# Patient Record
Sex: Female | Born: 1945 | Race: White | Hispanic: No | Marital: Married | Smoking: Never smoker
Health system: Southern US, Community
[De-identification: ages and names within clinical notes are randomized; demographics above are authoritative.]

## PROBLEM LIST (undated history)

## (undated) DIAGNOSIS — F329 Major depressive disorder, single episode, unspecified: Secondary | ICD-10-CM

## (undated) DIAGNOSIS — E785 Hyperlipidemia, unspecified: Secondary | ICD-10-CM

## (undated) DIAGNOSIS — I2699 Other pulmonary embolism without acute cor pulmonale: Secondary | ICD-10-CM

## (undated) DIAGNOSIS — I10 Essential (primary) hypertension: Secondary | ICD-10-CM

## (undated) DIAGNOSIS — R55 Syncope and collapse: Secondary | ICD-10-CM

## (undated) DIAGNOSIS — F32A Depression, unspecified: Secondary | ICD-10-CM

## (undated) DIAGNOSIS — E213 Hyperparathyroidism, unspecified: Secondary | ICD-10-CM

## (undated) HISTORY — DX: Syncope and collapse: R55

## (undated) HISTORY — PX: ORIF ANKLE FRACTURE: SHX5408

## (undated) HISTORY — PX: OTHER SURGICAL HISTORY: SHX169

## (undated) HISTORY — PX: PARATHYROIDECTOMY: SHX19

## (undated) HISTORY — PX: TONSILLECTOMY: SUR1361

---

## 2012-08-09 DIAGNOSIS — E785 Hyperlipidemia, unspecified: Secondary | ICD-10-CM | POA: Insufficient documentation

## 2013-05-15 DIAGNOSIS — M810 Age-related osteoporosis without current pathological fracture: Secondary | ICD-10-CM | POA: Insufficient documentation

## 2013-05-15 DIAGNOSIS — J309 Allergic rhinitis, unspecified: Secondary | ICD-10-CM | POA: Insufficient documentation

## 2013-05-15 DIAGNOSIS — M1990 Unspecified osteoarthritis, unspecified site: Secondary | ICD-10-CM | POA: Insufficient documentation

## 2013-05-15 DIAGNOSIS — K219 Gastro-esophageal reflux disease without esophagitis: Secondary | ICD-10-CM | POA: Insufficient documentation

## 2013-05-21 DIAGNOSIS — N183 Chronic kidney disease, stage 3 unspecified: Secondary | ICD-10-CM | POA: Insufficient documentation

## 2013-05-21 DIAGNOSIS — R7303 Prediabetes: Secondary | ICD-10-CM | POA: Insufficient documentation

## 2013-06-16 DIAGNOSIS — F339 Major depressive disorder, recurrent, unspecified: Secondary | ICD-10-CM | POA: Insufficient documentation

## 2013-10-03 DIAGNOSIS — I2699 Other pulmonary embolism without acute cor pulmonale: Secondary | ICD-10-CM

## 2013-10-03 HISTORY — DX: Other pulmonary embolism without acute cor pulmonale: I26.99

## 2013-10-23 ENCOUNTER — Encounter (HOSPITAL_COMMUNITY): Payer: Self-pay | Admitting: Internal Medicine

## 2013-10-23 ENCOUNTER — Inpatient Hospital Stay (HOSPITAL_COMMUNITY)
Admission: AD | Admit: 2013-10-23 | Discharge: 2013-11-02 | DRG: 175 | Disposition: A | Discharge status: Home Health | Payer: Medicare Other | Source: Other Acute Inpatient Hospital | Attending: Emergency Medicine | Admitting: Emergency Medicine

## 2013-10-23 ENCOUNTER — Inpatient Hospital Stay (HOSPITAL_COMMUNITY): Payer: Medicare Other

## 2013-10-23 DIAGNOSIS — J9620 Acute and chronic respiratory failure, unspecified whether with hypoxia or hypercapnia: Secondary | ICD-10-CM | POA: Diagnosis present

## 2013-10-23 DIAGNOSIS — R0682 Tachypnea, not elsewhere classified: Secondary | ICD-10-CM | POA: Diagnosis present

## 2013-10-23 DIAGNOSIS — E8779 Other fluid overload: Secondary | ICD-10-CM | POA: Diagnosis present

## 2013-10-23 DIAGNOSIS — I82409 Acute embolism and thrombosis of unspecified deep veins of unspecified lower extremity: Secondary | ICD-10-CM | POA: Diagnosis present

## 2013-10-23 DIAGNOSIS — J90 Pleural effusion, not elsewhere classified: Secondary | ICD-10-CM | POA: Diagnosis present

## 2013-10-23 DIAGNOSIS — I10 Essential (primary) hypertension: Secondary | ICD-10-CM | POA: Diagnosis present

## 2013-10-23 DIAGNOSIS — J189 Pneumonia, unspecified organism: Secondary | ICD-10-CM | POA: Diagnosis present

## 2013-10-23 DIAGNOSIS — S82899A Other fracture of unspecified lower leg, initial encounter for closed fracture: Secondary | ICD-10-CM | POA: Diagnosis present

## 2013-10-23 DIAGNOSIS — J9601 Acute respiratory failure with hypoxia: Secondary | ICD-10-CM

## 2013-10-23 DIAGNOSIS — F329 Major depressive disorder, single episode, unspecified: Secondary | ICD-10-CM | POA: Diagnosis present

## 2013-10-23 DIAGNOSIS — I2692 Saddle embolus of pulmonary artery without acute cor pulmonale: Principal | ICD-10-CM | POA: Diagnosis present

## 2013-10-23 DIAGNOSIS — E785 Hyperlipidemia, unspecified: Secondary | ICD-10-CM | POA: Diagnosis present

## 2013-10-23 DIAGNOSIS — F3289 Other specified depressive episodes: Secondary | ICD-10-CM | POA: Diagnosis present

## 2013-10-23 DIAGNOSIS — J9621 Acute and chronic respiratory failure with hypoxia: Secondary | ICD-10-CM | POA: Diagnosis present

## 2013-10-23 DIAGNOSIS — E871 Hypo-osmolality and hyponatremia: Secondary | ICD-10-CM | POA: Diagnosis present

## 2013-10-23 DIAGNOSIS — R918 Other nonspecific abnormal finding of lung field: Secondary | ICD-10-CM

## 2013-10-23 DIAGNOSIS — I2699 Other pulmonary embolism without acute cor pulmonale: Secondary | ICD-10-CM

## 2013-10-23 DIAGNOSIS — E876 Hypokalemia: Secondary | ICD-10-CM | POA: Diagnosis present

## 2013-10-23 DIAGNOSIS — D649 Anemia, unspecified: Secondary | ICD-10-CM | POA: Diagnosis present

## 2013-10-23 DIAGNOSIS — Z86711 Personal history of pulmonary embolism: Secondary | ICD-10-CM | POA: Diagnosis present

## 2013-10-23 DIAGNOSIS — R0602 Shortness of breath: Secondary | ICD-10-CM | POA: Diagnosis present

## 2013-10-23 DIAGNOSIS — E213 Hyperparathyroidism, unspecified: Secondary | ICD-10-CM | POA: Diagnosis present

## 2013-10-23 HISTORY — DX: Hyperparathyroidism, unspecified: E21.3

## 2013-10-23 HISTORY — DX: Major depressive disorder, single episode, unspecified: F32.9

## 2013-10-23 HISTORY — DX: Depression, unspecified: F32.A

## 2013-10-23 HISTORY — DX: Hyperlipidemia, unspecified: E78.5

## 2013-10-23 HISTORY — DX: Essential (primary) hypertension: I10

## 2013-10-23 HISTORY — DX: Other pulmonary embolism without acute cor pulmonale: I26.99

## 2013-10-23 LAB — CBC WITH DIFFERENTIAL/PLATELET
Basophils Absolute: 0 10*3/uL (ref 0.0–0.1)
Basophils Relative: 0 % (ref 0–1)
EOS ABS: 0 10*3/uL (ref 0.0–0.7)
Eosinophils Relative: 0 % (ref 0–5)
HCT: 27.4 % — ABNORMAL LOW (ref 36.0–46.0)
HEMOGLOBIN: 9.3 g/dL — AB (ref 12.0–15.0)
Lymphocytes Relative: 6 % — ABNORMAL LOW (ref 12–46)
Lymphs Abs: 0.8 10*3/uL (ref 0.7–4.0)
MCH: 29.2 pg (ref 26.0–34.0)
MCHC: 33.9 g/dL (ref 30.0–36.0)
MCV: 86.2 fL (ref 78.0–100.0)
MONOS PCT: 12 % (ref 3–12)
Monocytes Absolute: 1.7 10*3/uL — ABNORMAL HIGH (ref 0.1–1.0)
NEUTROS PCT: 82 % — AB (ref 43–77)
Neutro Abs: 12 10*3/uL — ABNORMAL HIGH (ref 1.7–7.7)
Platelets: 285 10*3/uL (ref 150–400)
RBC: 3.18 MIL/uL — ABNORMAL LOW (ref 3.87–5.11)
RDW: 14 % (ref 11.5–15.5)
WBC: 14.5 10*3/uL — ABNORMAL HIGH (ref 4.0–10.5)

## 2013-10-23 LAB — COMPREHENSIVE METABOLIC PANEL
ALBUMIN: 2.5 g/dL — AB (ref 3.5–5.2)
ALT: 41 U/L — AB (ref 0–35)
AST: 37 U/L (ref 0–37)
Alkaline Phosphatase: 214 U/L — ABNORMAL HIGH (ref 39–117)
Anion gap: 13 (ref 5–15)
BUN: 5 mg/dL — ABNORMAL LOW (ref 6–23)
CALCIUM: 8.8 mg/dL (ref 8.4–10.5)
CO2: 23 mEq/L (ref 19–32)
Chloride: 99 mEq/L (ref 96–112)
Creatinine, Ser: 0.72 mg/dL (ref 0.50–1.10)
GFR calc Af Amer: >90 mL/min (ref >90)
GFR calc non Af Amer: 87 mL/min — ABNORMAL LOW (ref >90)
GLUCOSE: 102 mg/dL — AB (ref 70–99)
POTASSIUM: 3.9 meq/L (ref 3.7–5.3)
SODIUM: 135 meq/L — AB (ref 137–147)
TOTAL PROTEIN: 6.4 g/dL (ref 6.0–8.3)
Total Bilirubin: 0.7 mg/dL (ref 0.3–1.2)

## 2013-10-23 LAB — LACTIC ACID, PLASMA: LACTIC ACID, VENOUS: 1.5 mmol/L (ref 0.5–2.2)

## 2013-10-23 LAB — GLUCOSE, CAPILLARY: Glucose-Capillary: 120 mg/dL — ABNORMAL HIGH (ref 70–99)

## 2013-10-23 LAB — MRSA PCR SCREENING: MRSA by PCR: NEGATIVE

## 2013-10-23 LAB — PHOSPHORUS: PHOSPHORUS: 1.4 mg/dL — AB (ref 2.3–4.6)

## 2013-10-23 LAB — PRO B NATRIURETIC PEPTIDE: Pro B Natriuretic peptide (BNP): 3759 pg/mL — ABNORMAL HIGH (ref 0–125)

## 2013-10-23 LAB — PROTIME-INR
INR: 1.26 (ref 0.00–1.49)
Prothrombin Time: 15.8 seconds — ABNORMAL HIGH (ref 11.6–15.2)

## 2013-10-23 LAB — MAGNESIUM: Magnesium: 1.9 mg/dL (ref 1.5–2.5)

## 2013-10-23 LAB — TROPONIN I

## 2013-10-23 LAB — HEPARIN LEVEL (UNFRACTIONATED): Heparin Unfractionated: <0.1 IU/mL — ABNORMAL LOW (ref 0.30–0.70)

## 2013-10-23 MED ORDER — DEXTROSE 5 % IV SOLN: 500.0000 mg | Freq: Once | INTRAVENOUS | Status: DC

## 2013-10-23 MED ORDER — FUROSEMIDE 10 MG/ML IJ SOLN
40.0000 mg | INTRAMUSCULAR | Status: AC
Start: 2013-10-23 — End: 2013-10-23
  Administered 2013-10-23: 40 mg via INTRAVENOUS
  Filled 2013-10-23: qty 4

## 2013-10-23 MED ORDER — OXYMETAZOLINE HCL 0.05 % NA SOLN: 1.0000 | Freq: Two times a day (BID) | NASAL | Status: DC | PRN

## 2013-10-23 MED ORDER — HEPARIN (PORCINE) IN NACL 100-0.45 UNIT/ML-% IJ SOLN
1850.0000 [IU]/h | INTRAMUSCULAR | Status: DC
  Administered 2013-10-25: 1850 [IU]/h via INTRAVENOUS
  Filled 2013-10-23 (×4): qty 250

## 2013-10-23 MED ORDER — SODIUM CHLORIDE 0.9 % IV SOLN
INTRAVENOUS | Status: DC
  Administered 2013-10-23: 18:00:00 via INTRAVENOUS

## 2013-10-23 MED ORDER — BUPROPION HCL ER (XL) 300 MG PO TB24
450.0000 mg | ORAL_TABLET | Freq: Every day | ORAL | Status: DC
  Administered 2013-10-25 – 2013-11-02 (×9): 450 mg via ORAL
  Filled 2013-10-23 (×11): qty 1

## 2013-10-23 MED ORDER — OXYCODONE-ACETAMINOPHEN 5-325 MG PO TABS
1.0000 | ORAL_TABLET | Freq: Four times a day (QID) | ORAL | Status: DC | PRN
  Administered 2013-10-24: 2 via ORAL
  Filled 2013-10-23: qty 2

## 2013-10-23 MED ORDER — SODIUM CHLORIDE 0.9 % IV SOLN: 250.0000 mL | INTRAVENOUS | Status: DC | PRN

## 2013-10-23 MED ORDER — HEPARIN BOLUS VIA INFUSION
2000.0000 [IU] | Freq: Once | INTRAVENOUS | Status: AC
  Administered 2013-10-23: 2000 [IU] via INTRAVENOUS
  Filled 2013-10-23: qty 2000

## 2013-10-23 MED ORDER — VANCOMYCIN HCL IN DEXTROSE 1-5 GM/200ML-% IV SOLN
1000.0000 mg | INTRAVENOUS | Status: AC
  Administered 2013-10-23: 1000 mg via INTRAVENOUS
  Filled 2013-10-23: qty 200

## 2013-10-23 MED ORDER — FLUTICASONE PROPIONATE 50 MCG/ACT NA SUSP: 1.0000 | Freq: Every day | NASAL | Status: DC | PRN

## 2013-10-23 MED ORDER — DEXTROSE 5 % IV SOLN
250.0000 mg | INTRAVENOUS | Status: DC
  Administered 2013-10-24 – 2013-10-25 (×3): 250 mg via INTRAVENOUS
  Filled 2013-10-23 (×3): qty 250

## 2013-10-23 MED ORDER — CETYLPYRIDINIUM CHLORIDE 0.05 % MT LIQD: 7.0000 mL | Freq: Two times a day (BID) | OROMUCOSAL | Status: DC

## 2013-10-23 MED ORDER — ATORVASTATIN CALCIUM 20 MG PO TABS
20.0000 mg | ORAL_TABLET | Freq: Every day | ORAL | Status: DC
  Administered 2013-10-25 – 2013-11-01 (×8): 20 mg via ORAL
  Filled 2013-10-23 (×11): qty 1

## 2013-10-23 MED ORDER — CYCLOBENZAPRINE HCL 5 MG PO TABS
5.0000 mg | ORAL_TABLET | Freq: Three times a day (TID) | ORAL | Status: DC
  Administered 2013-10-23 – 2013-11-02 (×26): 5 mg via ORAL
  Filled 2013-10-23 (×32): qty 1

## 2013-10-23 MED ORDER — VENLAFAXINE HCL ER 150 MG PO CP24
300.0000 mg | ORAL_CAPSULE | Freq: Every day | ORAL | Status: DC
  Administered 2013-10-25 – 2013-11-02 (×9): 300 mg via ORAL
  Filled 2013-10-23 (×11): qty 2

## 2013-10-23 MED ORDER — CEFEPIME HCL 2 G IJ SOLR
2.0000 g | INTRAMUSCULAR | Status: AC
  Administered 2013-10-23: 2 g via INTRAVENOUS
  Filled 2013-10-23: qty 2

## 2013-10-23 NOTE — Progress Notes (Signed)
ANTICOAGULATION CONSULT NOTE - Initial Consult  Pharmacy Consult for Heparin, Vanco, Cefepime Indication: saddle PE, R DVT + PNA  Not on File  Patient Measurements: Height: _0  (162.6 cm) Weight: 156 lb (70.761 kg) IBW/kg (Calculated) : 54.7 Heparin Dosing Weight:  69 kg  Vital Signs: Temp: 99.2 F (37.3 C) (08/21 1800) Temp src: Oral (08/21 1800) BP: 156/78 mmHg (08/21 1800) Pulse Rate: 120 (08/21 1800)  Labs: No results found for this basename: HGB, HCT, PLT, APTT, LABPROT, INR, HEPARINUNFRC, CREATININE, CKTOTAL, CKMB, TROPONINI,  in the last 72 hours  CrCl is unknown because no creatinine reading has been taken.   Medical History: No past medical history on file.  Medications:  No prescriptions prior to admission    Assessment: 68 y/o F transferred to [facility] from [facility] (admitted since 8/18) with worsening SOB.  Patient sustained a R ankle, tibia, and fibula fx several weeks ago. She underwent ORIF>>developed R DVT>>SOB/CP and came to [facility] ED 8/18. At [facility], pt was dx with multiple PEs with respiratory failure, large saddle PE with R heart strain, UTI, PNA. No lab results found in chart for at least 48hrs   Anticoagulation: Saddle PE. Hgb 9.3. Plts 285. Heparin from [facility] at 950 uts/hr with HL <0.1. INR 1.26 may be a reflection of transaminitis.  Infectious Disease: UTI/PNA on Rocephin/Azith at [facility]. Change to Vanco/Cefepime. WBC remain elevated at 14.5.  Cardiovascular: h/o HTN,HLD. Echo 8/18 with EF >70%  Endocrinology: no history noted  Gastrointestinal / Nutrition: Transaminitis with alk phos 214, ALT 41.  Neurology: Depression on Buproprion and Effexor  Nephrology: Scr 0.72. CrCl 65. Phos low at 1.4.   Pulmonary: Venti mask   Hematology / Oncology:   PTA Medication Issues  Best Practices: IV heparin   Goal of Therapy:  Heparin level 0.3-0.7 units/ml Vanco trough 15-20 Monitor platelets by anticoagulation protocol: Yes    Plan:  1. Heparin bolus 2000 units. Increase infusion to 1250 units/hr Daily heparin level and CBC 2. Vanco 1g IV q12h. Trough at steady state 3. Cefepime 2g IV x 1 then 1g IV q8hr.  Tran Randle S. Alford Highland, PharmD, BCPS Clinical Staff Pharmacist Pager [phone]  Eilene Ghazi Stillinger 10/23/2013,6:41 PM

## 2013-10-23 NOTE — H&P (Signed)
PULMONARY / CRITICAL CARE MEDICINE HISTORY AND PHYSICAL EXAMINATION   Name: Christy Short MRN: [id number] DOB: 05/26/1945    ADMISSION DATE:  10/23/2013  PRIMARY SERVICE: PCCM  CHIEF COMPLAINT:  SOB   BRIEF PATIENT DESCRIPTION: [age] F with submassive PE transferred from [facility] in setting of worsening hypoxemia.  SIGNIFICANT EVENTS / STUDIES:  CT ([facility]) 8/18: PE present, other details not available   Lower Extremity Doppler 8/19 (+) DVT  Echocardiogram on 10/20/2013 which noted an EF of > 70%, a moderately dilated RV, and moderately reduced RV function.  Repeat CTA 8/21: Large volume embolus including small saddle PE, evidence of RH strain, RV/LV ratio 1.1  LINES / TUBES: PIVs  CULTURES: None  ANTIBIOTICS: Vanc 8/21 - Cefepmine 8/21 - Azithro 8/21 - CTX x 1 8/21 ([facility]) Azithro x 1 8/21 ([facility])  HISTORY OF PRESENT ILLNESS:  Ms. Christy Short is a 68 yo F with HTN, HL, and a recent PE discovered following a right complex ankle fracture who [facility] hypox[facility]. She was first admitted to [facility] on 8/18 after she presented to her PMD for "not feeling right" with CP and SOB and was found to have the PE and a RLE DVT. She had continued worsening of her hypoxemia and developed a mild fever. [facility] possible catheter-directed thrombolysis. This evening she notes some SOB but is comfortable when she is not moving.   PAST MEDICAL HISTORY :  Past Medical History  Diagnosis Date  . Pulmonary embolism 10/2013  . Hyperparathyroidism   . HTN (hypertension)   . Hyperlipidemia   . Depression    Past Surgical History  Procedure Laterality Date  . Parathyroidectomy      x2  . Orif ankle fracture     Prior to Admission medications   Medication Sig Start Date End Date Taking? Authorizing Provider  fluticasone (FLONASE) 50 MCG/ACT nasal spray Place 1 spray into both nostrils daily as needed for allergies or rhinitis.    Yes Historical Provider, MD  oxymetazoline (AFRIN) 0.05 % nasal spray Place 1 spray into both nostrils 2 (two) times daily as needed for congestion.   Yes Historical Provider, MD   No Known Allergies  FAMILY HISTORY:  No family history on file. SOCIAL HISTORY:  has no tobacco, alcohol, and drug history on file.  REVIEW OF SYSTEMS:  A 12-system ROS was conducted and, unless otherwise specified in the HPI, was negative.   SUBJECTIVE:   VITAL SIGNS: Temp:  [99.2 F (37.3 C)] 99.2 F (37.3 C) (08/21 1800) Pulse Rate:  [118-120] 118 (08/21 1900) Resp:  [29] 29 (08/21 1900) BP: (126-156)/(59-78) 126/59 mmHg (08/21 1900) SpO2:  [92 %-93 %] 93 % (08/21 1900) FiO2 (%):  [55 %-100 %] 100 % (08/21 1940) Weight:  [156 lb (70.761 kg)] 156 lb (70.761 kg) (08/21 1800) HEMODYNAMICS:   VENTILATOR SETTINGS: Vent Mode:  [-]  FiO2 (%):  [55 %-100 %] 100 % INTAKE / OUTPUT: Intake/Output     08/21 0701 - 08/22 0700   Total Intake(mL/kg)    Urine (mL/kg/hr) 225   Total Output 225   Net -225         PHYSICAL EXAMINATION: General:  Elderly F in mild resp distress Neuro:  Intact, flat affect HEENT:  Sclera anicteric, conjunctiva pink, MMM, OP clear Neck: Supple and midline trachea, (-) LAN, JVD + to angle of jaw Cardiovascular:  Tachycardic, RR, NS1/S2, (-) MRG Lungs:  Crackles up  2/3s of back Abdomen:  S/NT/ND/(+)BS Musculoskeletal:  2+ pitting edema to knees bilaterally Skin:  (-) Rash  LABS:  CBC No results found for this basename: WBC, HGB, HCT, PLT,  in the last 168 hours Coag's No results found for this basename: APTT, INR,  in the last 168 hours BMET No results found for this basename: NA, K, CL, CO2, BUN, CREATININE, GLUCOSE,  in the last 168 hours Electrolytes No results found for this basename: CALCIUM, MG, PHOS,  in the last 168 hours Sepsis Markers No results found for this basename: LATICACIDVEN, PROCALCITON, O2SATVEN,  in the last 168 hours ABG No results found for  this basename: PHART, PCO2ART, PO2ART,  in the last 168 hours Liver Enzymes No results found for this basename: AST, ALT, ALKPHOS, BILITOT, ALBUMIN,  in the last 168 hours Cardiac Enzymes No results found for this basename: TROPONINI, PROBNP,  in the last 168 hours Glucose  Recent Labs Lab 10/23/13 1824  GLUCAP 120*    Imaging Dg Chest Port 1 View  10/23/2013   CLINICAL DATA:  Hypoxia  EXAM: PORTABLE CHEST - 1 VIEW  COMPARISON:  None.  FINDINGS: Borderline cardiomegaly. Bilateral small pleural effusion. There is hazy airspace opacity in lingula. Streaky infiltrate noted in right upper lobe peripheral. Findings suspicious for multifocal pneumonia. No convincing pulmonary edema.  IMPRESSION: Bilateral small pleural effusion. There is hazy airspace opacity in lingula. Streaky infiltrate noted in right upper lobe peripheral. Findings suspicious for multifocal pneumonia. No convincing pulmonary edema.   Electronically Signed   By: Natasha Mead M.D.   On: 10/23/2013 19:03    EKG: Not obtained yet. CXR: Personally reviewed. Diffuse interstitial pattern bilateral with patchy bilateral opacities. ? Right pleural effusion.   ASSESSMENT / PLAN:  PULMONARY A: Acute Hypoxic Resp Failure: 2/2 PE and likely fluid overload Submassive PE: Unfortunately, images not included in transfer packet but fortunately, Dr. Miles Costain of [facility] able to see images. He agrees with interpretation. Currently she appears HD stable enough to wait for catheter-directed lysis. There was some confusion in the transfer process, it is unlikely that the two CTAs were compared as the initial CTA was obtained in a different healthcare system. Dr. Miles Costain was able to review today's CT. P:   Cont supplemental O2 GENTLE DIURESIS as RV is Preload Depenedent [facility] consult in AM Heparin GTT  CARDIOVASCULAR A: HTN HL P:   Hold Antihypertensives Continue Home Statin  RENAL A: Unknown Cr, None Included in Transfer Material  P:   Check  Basic Labs  GASTROINTESTINAL A: No acute issues  HEMATOLOGIC A: PE/DVT  INFECTIOUS A: RO HCAP:  P:   Cultures Empiric Vanc/Cefepime/Azithro  ENDOCRINE A: Hx Hyperparathyroidism  P:   Monitor Ca  NEUROLOGIC A: Depression:  P:   Cont Home Meds  BEST PRACTICE / DISPOSITION Level of Care:  ICU Primary Service:  PCCM Consultants:  [facility] Code Status:  Full Diet:  NPO after midnight DVT Px:  On Hep GTT GI Px:  None Skin Integrity:  Intact Social / Family:  Updated at Bedside  TODAY'S SUMMARY:   I have personally obtained a history, examined the patient, evaluated laboratory and imaging results, formulated the assessment and plan and placed orders.  CRITICAL CARE: The patient is critically ill with multiple organ systems failure and requires high complexity decision making for assessment and support, frequent evaluation and titration of therapies, application of advanced monitoring technologies and extensive interpretation of multiple databases. Critical Care Time devoted to patient care services  described in this note is 65 minutes.   Evalyn Casco, MD Pulmonary and Critical Care Medicine [facility] Pager: [phone]   10/23/2013, 7:56 PM

## 2013-10-24 ENCOUNTER — Inpatient Hospital Stay (HOSPITAL_COMMUNITY): Payer: Medicare Other

## 2013-10-24 ENCOUNTER — Encounter (HOSPITAL_COMMUNITY): Payer: Self-pay | Admitting: *Deleted

## 2013-10-24 DIAGNOSIS — I2699 Other pulmonary embolism without acute cor pulmonale: Secondary | ICD-10-CM

## 2013-10-24 LAB — CBC
HCT: 29.8 % — ABNORMAL LOW (ref 36.0–46.0)
HCT: 26.4 % — ABNORMAL LOW (ref 36.0–46.0)
HEMATOCRIT: 25 % — AB (ref 36.0–46.0)
HEMOGLOBIN: 10.1 g/dL — AB (ref 12.0–15.0)
HEMOGLOBIN: 9 g/dL — AB (ref 12.0–15.0)
HEMOGLOBIN: 8.6 g/dL — AB (ref 12.0–15.0)
MCH: 28.9 pg (ref 26.0–34.0)
MCH: 29.1 pg (ref 26.0–34.0)
MCH: 29.5 pg (ref 26.0–34.0)
MCHC: 33.9 g/dL (ref 30.0–36.0)
MCHC: 34.1 g/dL (ref 30.0–36.0)
MCHC: 34.4 g/dL (ref 30.0–36.0)
MCV: 85.4 fL (ref 78.0–100.0)
MCV: 85.4 fL (ref 78.0–100.0)
MCV: 85.6 fL (ref 78.0–100.0)
PLATELETS: 306 10*3/uL (ref 150–400)
Platelets: 282 10*3/uL (ref 150–400)
Platelets: 255 10*3/uL (ref 150–400)
RBC: 3.49 MIL/uL — ABNORMAL LOW (ref 3.87–5.11)
RBC: 3.09 MIL/uL — AB (ref 3.87–5.11)
RBC: 2.92 MIL/uL — AB (ref 3.87–5.11)
RDW: 14.1 % (ref 11.5–15.5)
RDW: 14.1 % (ref 11.5–15.5)
RDW: 14 % (ref 11.5–15.5)
WBC: 14.3 10*3/uL — AB (ref 4.0–10.5)
WBC: 14.1 10*3/uL — ABNORMAL HIGH (ref 4.0–10.5)
WBC: 14.1 10*3/uL — AB (ref 4.0–10.5)

## 2013-10-24 LAB — BASIC METABOLIC PANEL
ANION GAP: 15 (ref 5–15)
BUN: 6 mg/dL (ref 6–23)
CALCIUM: 9.1 mg/dL (ref 8.4–10.5)
CHLORIDE: 94 meq/L — AB (ref 96–112)
CO2: 26 mEq/L (ref 19–32)
CREATININE: 0.81 mg/dL (ref 0.50–1.10)
GFR calc Af Amer: 85 mL/min — ABNORMAL LOW (ref >90)
GFR calc non Af Amer: 73 mL/min — ABNORMAL LOW (ref >90)
Glucose, Bld: 134 mg/dL — ABNORMAL HIGH (ref 70–99)
Potassium: 3.6 mEq/L — ABNORMAL LOW (ref 3.7–5.3)
Sodium: 135 mEq/L — ABNORMAL LOW (ref 137–147)

## 2013-10-24 LAB — HEPARIN LEVEL (UNFRACTIONATED)
HEPARIN UNFRACTIONATED: 0.26 [IU]/mL — AB (ref 0.30–0.70)
Heparin Unfractionated: 0.25 IU/mL — ABNORMAL LOW (ref 0.30–0.70)
Heparin Unfractionated: 0.24 IU/mL — ABNORMAL LOW (ref 0.30–0.70)

## 2013-10-24 LAB — TROPONIN I: Troponin I: <0.3 ng/mL (ref <0.30)

## 2013-10-24 LAB — MAGNESIUM: Magnesium: 1.9 mg/dL (ref 1.5–2.5)

## 2013-10-24 LAB — FIBRINOGEN: Fibrinogen: >800 mg/dL — ABNORMAL HIGH (ref 204–475)

## 2013-10-24 MED ORDER — IOHEXOL 300 MG/ML  SOLN
50.0000 mL | Freq: Once | INTRAMUSCULAR | Status: AC | PRN
  Administered 2013-10-24: 20 mL via INTRAVENOUS

## 2013-10-24 MED ORDER — SODIUM CHLORIDE 0.9 % IV SOLN
12.0000 mg | INTRAVENOUS | Status: AC
  Filled 2013-10-24: qty 12

## 2013-10-24 MED ORDER — SODIUM CHLORIDE 0.9 % IV SOLN: 250.0000 mL | INTRAVENOUS | Status: DC | PRN

## 2013-10-24 MED ORDER — SODIUM CHLORIDE 0.9 % IV SOLN: INTRAVENOUS | Status: DC

## 2013-10-24 MED ORDER — SODIUM CHLORIDE 0.9 % IV SOLN
INTRAVENOUS | Status: DC
  Administered 2013-10-24: 12:00:00 via INTRAVENOUS

## 2013-10-24 MED ORDER — FENTANYL CITRATE 0.05 MG/ML IJ SOLN
INTRAMUSCULAR | Status: AC
  Filled 2013-10-24: qty 4

## 2013-10-24 MED ORDER — FENTANYL CITRATE 0.05 MG/ML IJ SOLN
INTRAMUSCULAR | Status: AC | PRN
  Administered 2013-10-24: 50 ug via INTRAVENOUS
  Administered 2013-10-24: 12.5 ug via INTRAVENOUS

## 2013-10-24 MED ORDER — ALTEPLASE 50 MG IV SOLR
12.0000 mg | Freq: Once | INTRAVENOUS | Status: DC
  Administered 2013-10-24: 12 mg via INTRAVENOUS
  Filled 2013-10-24: qty 12

## 2013-10-24 MED ORDER — MIDAZOLAM HCL 2 MG/2ML IJ SOLN
INTRAMUSCULAR | Status: AC
  Filled 2013-10-24: qty 4

## 2013-10-24 MED ORDER — SODIUM CHLORIDE 0.9 % IJ SOLN
3.0000 mL | INTRAMUSCULAR | Status: DC | PRN
Start: 2013-10-24 — End: 2013-10-29

## 2013-10-24 MED ORDER — ALTEPLASE 50 MG IV SOLR: 12.0000 mg | Freq: Once | INTRAVENOUS | Status: DC

## 2013-10-24 MED ORDER — LIDOCAINE HCL 1 % IJ SOLN
INTRAMUSCULAR | Status: AC
  Filled 2013-10-24: qty 20

## 2013-10-24 MED ORDER — SODIUM CHLORIDE 0.9 % IV SOLN: 12.0000 mg | Freq: Once | INTRAVENOUS | Status: DC

## 2013-10-24 MED ORDER — SODIUM CHLORIDE 0.9 % IJ SOLN: 3.0000 mL | INTRAMUSCULAR | Status: DC | PRN

## 2013-10-24 MED ORDER — MIDAZOLAM HCL 2 MG/2ML IJ SOLN
INTRAMUSCULAR | Status: AC | PRN
  Administered 2013-10-24: 1 mg via INTRAVENOUS
  Administered 2013-10-24: 0.5 mg via INTRAVENOUS

## 2013-10-24 MED ORDER — SODIUM CHLORIDE 0.9 % IJ SOLN
3.0000 mL | Freq: Two times a day (BID) | INTRAMUSCULAR | Status: DC
  Administered 2013-10-25 – 2013-10-29 (×8): 3 mL via INTRAVENOUS

## 2013-10-24 MED ORDER — VANCOMYCIN HCL IN DEXTROSE 1-5 GM/200ML-% IV SOLN
1000.0000 mg | Freq: Two times a day (BID) | INTRAVENOUS | Status: DC
  Administered 2013-10-24 – 2013-10-25 (×4): 1000 mg via INTRAVENOUS
  Filled 2013-10-24 (×6): qty 200

## 2013-10-24 MED ORDER — DEXTROSE 5 % IV SOLN
1.0000 g | Freq: Three times a day (TID) | INTRAVENOUS | Status: DC
  Administered 2013-10-24 – 2013-10-26 (×7): 1 g via INTRAVENOUS
  Filled 2013-10-24 (×9): qty 1

## 2013-10-24 MED ORDER — SODIUM CHLORIDE 0.9 % IJ SOLN
3.0000 mL | Freq: Two times a day (BID) | INTRAMUSCULAR | Status: DC
Start: 2013-10-24 — End: 2013-10-25
  Administered 2013-10-25: 3 mL via INTRAVENOUS

## 2013-10-24 MED ORDER — MORPHINE SULFATE 2 MG/ML IJ SOLN
2.0000 mg | INTRAMUSCULAR | Status: DC | PRN
  Administered 2013-10-24 – 2013-10-31 (×15): 2 mg via INTRAVENOUS
  Filled 2013-10-24 (×15): qty 1

## 2013-10-24 NOTE — Progress Notes (Signed)
ANTICOAGULATION CONSULT NOTE   Pharmacy Consult for Heparin Indication: pulmonary embolus  No Known Allergies  Patient Measurements: Height: 5\' 4"  (162.6 cm) Weight: 167 lb 1.7 oz (75.8 kg) IBW/kg (Calculated) : 54.7 Heparin Dosing Weight: 71  Vital Signs: Temp: 99.2 F (37.3 C) (08/22 1958) Temp src: Oral (08/22 1958) BP: 126/53 mmHg (08/22 2130) Pulse Rate: 107 (08/22 2130)  Labs:  Recent Labs  10/23/13 2011 10/23/13 2015 10/24/13 0315 10/24/13 0842 10/24/13 1710 10/24/13 2220  HGB 9.3*  --  10.1*  --  9.0* 8.6*  HCT 27.4*  --  29.8*  --  26.4* 25.0*  PLT 285  --  306  --  282 255  LABPROT 15.8*  --   --   --   --   --   INR 1.26  --   --   --   --   --   HEPARINUNFRC  --   --  0.25*  --  0.24* 0.26*  CREATININE 0.72  --  0.81  --   --   --   TROPONINI  --  <0.30 <0.30 <0.30  --   --     Estimated Creatinine Clearance: 67.1 ml/min (by C-G formula based on Cr of 0.81).  Assessment: 68 YO F with PE, undergoing catheter directed lysis , for heparin   Goal of Therapy:  Heparin level 0.3-0.7 units/ml per EKOS protocol Monitor platelets by anticoagulation protocol: Yes   Plan:  Increase Heparin 1700 units/hr Follow-up am labs.   Geannie RisenGreg Keyira Mondesir, PharmD, BCPS  10/24/2013,10:45 PM

## 2013-10-24 NOTE — Procedures (Signed)
Successful insertion of bilateral EKOS infusions caths in the pulmonary arteries for PE lysis No comp Stable Full report in PACS

## 2013-10-24 NOTE — Sedation Documentation (Signed)
Pressure 46/23 left pulmonary artery

## 2013-10-24 NOTE — H&P (Signed)
PULMONARY / CRITICAL CARE MEDICINE HISTORY AND PHYSICAL EXAMINATION   Name: Christy Short MRN: [id number] DOB: 07-10-1945    ADMISSION DATE:  10/23/2013  PRIMARY SERVICE: PCCM  CHIEF COMPLAINT:  SOB   BRIEF PATIENT DESCRIPTION: 14 F with submassive PE transferred from [facility] in setting of worsening hypoxemia.  SIGNIFICANT EVENTS / STUDIES:  CT ([facility]) 8/18: PE present, other details not available   Lower Extremity Doppler 8/19 (+) DVT  Echocardiogram on 10/20/2013 which noted an EF of > 70%, a moderately dilated RV, and moderately reduced RV function.  Repeat CTA 8/21: Large volume embolus including small saddle PE, evidence of RH strain, RV/LV ratio 1.1  LINES / TUBES: R groin sheath 8/22>>>  CULTURES: None  ANTIBIOTICS: Vanc 8/21 - Cefepmine 8/21 - Azithro 8/21 -   SUBJECTIVE:  S/p R sheath insertion and EKOS infusions for PE lysis in IR this am.  Worsening hypoxia, slightly increased WOB.    VITAL SIGNS: Temp:  [97.6 F (36.4 C)-99.2 F (37.3 C)] 98.1 F (36.7 C) (08/22 1140) Pulse Rate:  [96-120] 105 (08/22 1400) Resp:  [21-39] 39 (08/22 1400) BP: (92-169)/(36-142) 107/45 mmHg (08/22 1400) SpO2:  [88 %-100 %] 88 % (08/22 1400) FiO2 (%):  [55 %-100 %] 100 % (08/21 1940) Weight:  [156 lb (70.761 kg)-167 lb 1.7 oz (75.8 kg)] 167 lb 1.7 oz (75.8 kg) (08/22 0500) HEMODYNAMICS:   VENTILATOR SETTINGS: Vent Mode:  [-]  FiO2 (%):  [55 %-100 %] 100 % INTAKE / OUTPUT: Intake/Output     08/21 0701 - 08/22 0700 08/22 0701 - 08/23 0700   I.V. (mL/kg) 142.2 (1.9) 306.8 (4)   Other  125   IV Piggyback 325 50   Total Intake(mL/kg) 467.2 (6.2) 481.8 (6.4)   Urine (mL/kg/hr) 3450 50 (0.1)   Total Output 3450 50   Net -2982.8 +431.8          PHYSICAL EXAMINATION: General:  Elderly F in mild resp distress Neuro:  Drowsy, arousable, MAE HEENT:  Sclera anicteric, conjunctiva pink, MMM, OP clear Neck: Supple and midline trachea  Cardiovascular:  Tachycardic,  RR, NS1/S2, (-) MRG Lungs:  resps even mildly labored on NRB, diminished bases, few scattered crackles  Abdomen:  S/NT/ND/(+)BS Musculoskeletal:  1+ pitting edema to knees bilaterally, RLE cast  Skin:  (-) Rash  LABS:  CBC  Recent Labs Lab 10/23/13 2011 10/24/13 0315  WBC 14.5* 14.3*  HGB 9.3* 10.1*  HCT 27.4* 29.8*  PLT 285 306   Coag's  Recent Labs Lab 10/23/13 2011  INR 1.26   BMET  Recent Labs Lab 10/23/13 2011 10/24/13 0315  NA 135* 135*  K 3.9 3.6*  CL 99 94*  CO2 23 26  BUN 5* 6  CREATININE 0.72 0.81  GLUCOSE 102* 134*   Electrolytes  Recent Labs Lab 10/23/13 2011 10/24/13 0315  CALCIUM 8.8 9.1  MG 1.9 1.9  PHOS 1.4*  --    Sepsis Markers  Recent Labs Lab 10/23/13 2130  LATICACIDVEN 1.5   ABG No results found for this basename: PHART, PCO2ART, PO2ART,  in the last 168 hours Liver Enzymes  Recent Labs Lab 10/23/13 2011  AST 37  ALT 41*  ALKPHOS 214*  BILITOT 0.7  ALBUMIN 2.5*   Cardiac Enzymes  Recent Labs Lab 10/23/13 2015 10/24/13 0315 10/24/13 0842  TROPONINI <0.30 <0.30 <0.30  PROBNP 3759.0*  --   --    Glucose  Recent Labs Lab 10/23/13 1824  GLUCAP 120*    Imaging CXR -  RUL infiltrate, small bilat effusions -- ?pna v infarct  ASSESSMENT / PLAN:  PULMONARY A: Acute Hypoxic Resp Failure: worsening hypoxia.  Submassive PE - s/p catheter directed lysis 8/22 ?HCAP P:   Cont heparin gtt  Catheter directed lysis per IR --> 1/2 catheters not functioning  High flow O2  F/u CXR  abx as below  Tenuous - may need intubation if WOB worsens; will try BiPAP and see how she responds   CARDIOVASCULAR A: HTN HL P:   Hold Antihypertensives Stat 2D echo in setting PE  Continue Home Statin Trend troponin  RENAL Hyponatremia - mild  P:   F/u chem   GASTROINTESTINAL A: No acute issues  HEMATOLOGIC A: PE/DVT P:  Anti-coagulation as above   INFECTIOUS A: RO HCAP:  P:   F/u cultures  Continue  empiric Vanc/Cefepime/Azithro  ENDOCRINE A: Hx Hyperparathyroidism  P:   Monitor Ca  NEUROLOGIC A: Depression:  P:   Cont Home Meds   TODAY'S SUMMARY:  Submassive PE s/p catheter directed lysis.  Worsening hypoxemia ? Due to shifting clot vs pulm infarcts.  Tenuous.  Continue heparin gtt, empiric abx for ?HCAP.     CRITICAL CARE: The patient is critically ill with multiple organ systems failure and requires high complexity decision making for assessment and support, frequent evaluation and titration of therapies, application of advanced monitoring technologies and extensive interpretation of multiple databases. Critical Care Time devoted to patient care services described in this note is 45 minutes.   Dirk Dress, NP 10/24/2013  2:40 PM Pager: [phone] or [phone]  *Care during the described time interval was provided by me and/or other providers on the critical care team. I have reviewed this patient's available data, including medical history, events of note, physical examination and test results as part of my evaluation.   Levy Pupa, MD, PhD 10/24/2013, 4:33 PM [facility] [phone] or if no answer [phone]

## 2013-10-24 NOTE — Consult Note (Signed)
Reason for Consult:Worsening Hypoxia                                  Bilateral/saddle Pulm Embolus                                  + Rt Heart strain per CTA and Echo Referring Physician: Dr Christy [name].   HPI: Pt has hx Rt ankle fracture and casting 09/12/2013. Pt presented to [facility] yesterday with shortness of breath; weakness. Doppler + RLE DVT CTA was positive for B saddle Pulmonary Embolus with Rt heart strain. Echo + for heart strain per notes Was transferred to [facility] for PCCM evaluation Dr Lowry Ram has requested consult for possible Pulmonary Angiogram with possible [place] assisted thrombolysis pf B PE Dr Annamaria Boots has reviewed imaging and chart I have seen and examined pt Now scheduled for this procedure in IR   Past Medical History  Diagnosis Date  . Pulmonary embolism 10/2013  . Hyperparathyroidism   . HTN (hypertension)   . Hyperlipidemia   . Depression     Past Surgical History  Procedure Laterality Date  . Parathyroidectomy      x2  . Orif ankle fracture      History reviewed. No pertinent family history.  Social History:  reports that she has never smoked. She does not have any smokeless tobacco history on file. She reports that she does not drink alcohol or use illicit drugs.  Allergies: No Known Allergies  Medications: see chart  Results for orders placed during the hospital encounter of 10/23/13 (from the past 48 hour(s))  MRSA PCR SCREENING     Status: None   Collection Time    10/23/13  6:16 PM      Result Value Ref Range   MRSA by PCR NEGATIVE  NEGATIVE   Comment:            The GeneXpert MRSA Assay (FDA     approved for NASAL specimens     only), is one component of a     comprehensive MRSA colonization     surveillance program. It is not     intended to diagnose MRSA     infection nor to guide or     monitor treatment for     MRSA infections.  GLUCOSE, CAPILLARY     Status: Abnormal    Collection Time    10/23/13  6:24 PM      Result Value Ref Range   Glucose-Capillary 120 (*) 70 - 99 mg/dL  HEPARIN LEVEL (UNFRACTIONATED)     Status: Abnormal   Collection Time    10/23/13  8:10 PM      Result Value Ref Range   Heparin Unfractionated <0.10 (*) 0.30 - 0.70 IU/mL   Comment:            IF HEPARIN RESULTS ARE BELOW     EXPECTED VALUES, AND PATIENT     DOSAGE HAS BEEN CONFIRMED,     SUGGEST FOLLOW UP TESTING     OF ANTITHROMBIN III LEVELS.  COMPREHENSIVE METABOLIC PANEL     Status: Abnormal   Collection Time    10/23/13  8:11 PM      Result Value Ref Range   Sodium 135 (*) 137 - 147 mEq/L   Potassium  3.9  3.7 - 5.3 mEq/L   Chloride 99  96 - 112 mEq/L   CO2 23  19 - 32 mEq/L   Glucose, Bld 102 (*) 70 - 99 mg/dL   BUN 5 (*) 6 - 23 mg/dL   Creatinine, Ser 0.72  0.50 - 1.10 mg/dL   Calcium 8.8  8.4 - 10.5 mg/dL   Total Protein 6.4  6.0 - 8.3 g/dL   Albumin 2.5 (*) 3.5 - 5.2 g/dL   AST 37  0 - 37 U/L   ALT 41 (*) 0 - 35 U/L   Alkaline Phosphatase 214 (*) 39 - 117 U/L   Total Bilirubin 0.7  0.3 - 1.2 mg/dL   GFR calc non Af Amer 87 (*) >90 mL/min   GFR calc Af Amer >90  >90 mL/min   Comment: (NOTE)     The eGFR has been calculated using the CKD EPI equation.     This calculation has not been validated in all clinical situations.     eGFR's persistently <90 mL/min signify possible Chronic Kidney     Disease.   Anion gap 13  5 - 15  MAGNESIUM     Status: None   Collection Time    10/23/13  8:11 PM      Result Value Ref Range   Magnesium 1.9  1.5 - 2.5 mg/dL  PHOSPHORUS     Status: Abnormal   Collection Time    10/23/13  8:11 PM      Result Value Ref Range   Phosphorus 1.4 (*) 2.3 - 4.6 mg/dL  CBC WITH DIFFERENTIAL     Status: Abnormal   Collection Time    10/23/13  8:11 PM      Result Value Ref Range   WBC 14.5 (*) 4.0 - 10.5 K/uL   RBC 3.18 (*) 3.87 - 5.11 MIL/uL   Hemoglobin 9.3 (*) 12.0 - 15.0 g/dL   HCT 27.4 (*) 36.0 - 46.0 %   MCV 86.2  78.0 -  100.0 fL   MCH 29.2  26.0 - 34.0 pg   MCHC 33.9  30.0 - 36.0 g/dL   RDW 14.0  11.5 - 15.5 %   Platelets 285  150 - 400 K/uL   Neutrophils Relative % 82 (*) 43 - 77 %   Neutro Abs 12.0 (*) 1.7 - 7.7 K/uL   Lymphocytes Relative 6 (*) 12 - 46 %   Lymphs Abs 0.8  0.7 - 4.0 K/uL   Monocytes Relative 12  3 - 12 %   Monocytes Absolute 1.7 (*) 0.1 - 1.0 K/uL   Eosinophils Relative 0  0 - 5 %   Eosinophils Absolute 0.0  0.0 - 0.7 K/uL   Basophils Relative 0  0 - 1 %   Basophils Absolute 0.0  0.0 - 0.1 K/uL  PROTIME-INR     Status: Abnormal   Collection Time    10/23/13  8:11 PM      Result Value Ref Range   Prothrombin Time 15.8 (*) 11.6 - 15.2 seconds   INR 1.26  0.00 - 1.49  TROPONIN I     Status: None   Collection Time    10/23/13  8:15 PM      Result Value Ref Range   Troponin I <0.30  <0.30 ng/mL   Comment:            Due to the release kinetics of cTnI,     a negative result within the first hours  of the onset of symptoms does not rule out     myocardial infarction with certainty.     If myocardial infarction is still suspected,     repeat the test at appropriate intervals.  PRO B NATRIURETIC PEPTIDE     Status: Abnormal   Collection Time    10/23/13  8:15 PM      Result Value Ref Range   Pro B Natriuretic peptide (BNP) 3759.0 (*) 0 - 125 pg/mL  LACTIC ACID, PLASMA     Status: None   Collection Time    10/23/13  9:30 PM      Result Value Ref Range   Lactic Acid, Venous 1.5  0.5 - 2.2 mmol/L  TROPONIN I     Status: None   Collection Time    10/24/13  3:15 AM      Result Value Ref Range   Troponin I <0.30  <0.30 ng/mL   Comment:            Due to the release kinetics of cTnI,     a negative result within the first hours     of the onset of symptoms does not rule out     myocardial infarction with certainty.     If myocardial infarction is still suspected,     repeat the test at appropriate intervals.  CBC     Status: Abnormal   Collection Time    10/24/13  3:15  AM      Result Value Ref Range   WBC 14.3 (*) 4.0 - 10.5 K/uL   RBC 3.49 (*) 3.87 - 5.11 MIL/uL   Hemoglobin 10.1 (*) 12.0 - 15.0 g/dL   HCT 29.8 (*) 36.0 - 46.0 %   MCV 85.4  78.0 - 100.0 fL   MCH 28.9  26.0 - 34.0 pg   MCHC 33.9  30.0 - 36.0 g/dL   RDW 14.1  11.5 - 15.5 %   Platelets 306  150 - 400 K/uL  BASIC METABOLIC PANEL     Status: Abnormal   Collection Time    10/24/13  3:15 AM      Result Value Ref Range   Sodium 135 (*) 137 - 147 mEq/L   Potassium 3.6 (*) 3.7 - 5.3 mEq/L   Chloride 94 (*) 96 - 112 mEq/L   CO2 26  19 - 32 mEq/L   Glucose, Bld 134 (*) 70 - 99 mg/dL   BUN 6  6 - 23 mg/dL   Creatinine, Ser 0.81  0.50 - 1.10 mg/dL   Calcium 9.1  8.4 - 10.5 mg/dL   GFR calc non Af Amer 73 (*) >90 mL/min   GFR calc Af Amer 85 (*) >90 mL/min   Comment: (NOTE)     The eGFR has been calculated using the CKD EPI equation.     This calculation has not been validated in all clinical situations.     eGFR's persistently <90 mL/min signify possible Chronic Kidney     Disease.   Anion gap 15  5 - 15  MAGNESIUM     Status: None   Collection Time    10/24/13  3:15 AM      Result Value Ref Range   Magnesium 1.9  1.5 - 2.5 mg/dL  HEPARIN LEVEL (UNFRACTIONATED)     Status: Abnormal   Collection Time    10/24/13  3:15 AM      Result Value Ref Range   Heparin Unfractionated 0.25 (*) 0.30 -  0.70 IU/mL   Comment:            IF HEPARIN RESULTS ARE BELOW     EXPECTED VALUES, AND PATIENT     DOSAGE HAS BEEN CONFIRMED,     SUGGEST FOLLOW UP TESTING     OF ANTITHROMBIN III LEVELS.    Dg Chest Port 1 View  10/23/2013   CLINICAL DATA:  Hypoxia  EXAM: PORTABLE CHEST - 1 VIEW  COMPARISON:  None.  FINDINGS: Borderline cardiomegaly. Bilateral small pleural effusion. There is hazy airspace opacity in lingula. Streaky infiltrate noted in right upper lobe peripheral. Findings suspicious for multifocal pneumonia. No convincing pulmonary edema.  IMPRESSION: Bilateral small pleural effusion. There  is hazy airspace opacity in lingula. Streaky infiltrate noted in right upper lobe peripheral. Findings suspicious for multifocal pneumonia. No convincing pulmonary edema.   Electronically Signed   By: Lahoma Crocker M.D.   On: 10/23/2013 19:03    Review of Systems  Constitutional: Positive for malaise/fatigue and diaphoresis. Negative for fever, chills and weight loss.  Respiratory: Positive for shortness of breath.   Cardiovascular: Positive for chest pain.  Gastrointestinal: Negative for nausea and vomiting.  Neurological: Positive for weakness. Negative for dizziness and headaches.  Psychiatric/Behavioral: Negative for substance abuse.   Blood pressure 104/53, pulse 96, temperature 97.7 F (36.5 C), temperature source Oral, resp. rate 29, height _0  (1.626 m), weight 75.8 kg (167 lb 1.7 oz), SpO2 97.00%. Physical Exam  Constitutional: She is oriented to person, place, and time. She appears well-nourished.  Cardiovascular: Normal rate and regular rhythm.   No murmur heard. Respiratory: Effort normal. She has wheezes.  GI: Soft. Bowel sounds are normal. There is no tenderness.  Musculoskeletal: Normal range of motion. She exhibits no edema.  Rt ankle cast  Neurological: She is alert and oriented to person, place, and time.  groggy  Skin: Skin is warm and dry.  Psychiatric: She has a normal mood and affect.  Consented husband at bedside    Assessment/Plan: Hypoxia RLE DVT B saddle PE with + R heart strain per CTA/Echo Scheduled now for Pulm Angio with thrombolysis Pt and husband aware of procedure benefits and risks and agreeable to proceed Consent signed and in chart  [place] A 10/24/2013, 8:41 AM

## 2013-10-24 NOTE — Progress Notes (Signed)
Pt's Left sheath TPA pump noted to continually be alarming "occluded."  Dr. Miles CostainShick notified of issue. Instructed to stop TPA through left catheter, run TPA through right catheter for 24 hours and notify [employer] rep.  Lyda PeroneKirk with [employer] notified.  Attempted to fluid port with 3 mL NS per protocol without success.  TPA stopped, coolant line decreased to 5 mL/hr and sheath fluids maintained at 20 mL/hr.  Will continue to monitor.  Teliyah Royal, [facility]ilion[facility]issa Hope

## 2013-10-24 NOTE — Progress Notes (Signed)
ANTICOAGULATION CONSULT NOTE - Follow Up Consult  Pharmacy Consult for Heparin Indication: pulmonary embolus  No Known Allergies  Patient Measurements: Height: 5\' 4"  (162.6 cm) Weight: 167 lb 1.7 oz (75.8 kg) IBW/kg (Calculated) : 54.7 Heparin Dosing Weight: 71  Vital Signs: Temp: 98.1 F (36.7 C) (08/22 1140) Temp src: Oral (08/22 1140) BP: 105/53 mmHg (08/22 1700) Pulse Rate: 110 (08/22 1700)  Labs:  Recent Labs  10/23/13 2010  10/23/13 2011 10/23/13 2015 10/24/13 0315 10/24/13 0842 10/24/13 1710  HGB  --   < > 9.3*  --  10.1*  --  9.0*  HCT  --   --  27.4*  --  29.8*  --  26.4*  PLT  --   --  285  --  306  --  282  LABPROT  --   --  15.8*  --   --   --   --   INR  --   --  1.26  --   --   --   --   HEPARINUNFRC <0.10*  --   --   --  0.25*  --  0.24*  CREATININE  --   --  0.72  --  0.81  --   --   TROPONINI  --   --   --  <0.30 <0.30 <0.30  --   < > = values in this interval not displayed.  Estimated Creatinine Clearance: 67.1 ml/min (by C-G formula based on Cr of 0.81).   Medications:  Heparin @ 1350 units/hr.   Assessment: 67 YOF on IV heparin and catheter directed tpa lysis for saddle PE. Heparin level slightly sub-therapeutic at 0.25 on 1350 units/hr. No bleeding noted. Patient to receive tPA for 24hrs due to only one catheter site (other clotted off per RN).   Goal of Therapy:  Heparin level 0.3-0.7 units/ml per [employer] protocol Monitor platelets by anticoagulation protocol: Yes   Plan:  1. Increase heparin to 1550 units/hr.  2. Heparin level in 6 hrs per protocol.    Link SnufferJessica Izak Anding, PharmD, BCPS Clinical Pharmacist [phone] 10/24/2013,5:55 PM

## 2013-10-24 NOTE — Progress Notes (Signed)
ANTICOAGULATION CONSULT NOTE - Follow Up Consult  Pharmacy Consult for Heparin  Indication: pulmonary embolus  No Known Allergies  Patient Measurements: Height: 5\' 4"  (162.6 cm) Weight: 156 lb (70.761 kg) IBW/kg (Calculated) : 54.7  Vital Signs: Temp: 97.9 F (36.6 C) (08/22 0000) Temp src: Oral (08/22 0000) BP: 122/61 mmHg (08/22 0000) Pulse Rate: 113 (08/22 0000)  Labs:  Recent Labs  10/23/13 2010 10/23/13 2011 10/23/13 2015 10/24/13 0315  HGB  --  9.3*  --  10.1*  HCT  --  27.4*  --  29.8*  PLT  --  285  --  306  LABPROT  --  15.8*  --   --   INR  --  1.26  --   --   HEPARINUNFRC <0.10*  --   --  0.25*  CREATININE  --  0.72  --   --   TROPONINI  --   --  <0.30  --     Assessment: Sub-therapeutic heparin level on 1200 units/hr of heparin. Other labs as above. No issues per RN.   Goal of Therapy:  Heparin level 0.3-0.7 units/ml Monitor platelets by anticoagulation protocol: Yes   Plan:  -Increase heparin drip to 1350 units/hr -1100 HL -Daily CBC/HL -Monitor for bleeding  Abran DukeLedford, Dann Galicia 10/24/2013,4:01 AM

## 2013-10-25 ENCOUNTER — Inpatient Hospital Stay (HOSPITAL_COMMUNITY): Payer: Medicare Other

## 2013-10-25 DIAGNOSIS — I2699 Other pulmonary embolism without acute cor pulmonale: Secondary | ICD-10-CM

## 2013-10-25 LAB — FIBRINOGEN
Fibrinogen: >800 mg/dL — ABNORMAL HIGH (ref 204–475)
Fibrinogen: >800 mg/dL — ABNORMAL HIGH (ref 204–475)

## 2013-10-25 LAB — CBC
HEMATOCRIT: 24.4 % — AB (ref 36.0–46.0)
HEMATOCRIT: 25.8 % — AB (ref 36.0–46.0)
HEMOGLOBIN: 8.3 g/dL — AB (ref 12.0–15.0)
HEMOGLOBIN: 8.7 g/dL — AB (ref 12.0–15.0)
MCH: 29.2 pg (ref 26.0–34.0)
MCH: 28.9 pg (ref 26.0–34.0)
MCHC: 34 g/dL (ref 30.0–36.0)
MCHC: 33.7 g/dL (ref 30.0–36.0)
MCV: 85.9 fL (ref 78.0–100.0)
MCV: 85.7 fL (ref 78.0–100.0)
Platelets: 243 10*3/uL (ref 150–400)
Platelets: 266 10*3/uL (ref 150–400)
RBC: 2.84 MIL/uL — ABNORMAL LOW (ref 3.87–5.11)
RBC: 3.01 MIL/uL — ABNORMAL LOW (ref 3.87–5.11)
RDW: 14.2 % (ref 11.5–15.5)
RDW: 14.2 % (ref 11.5–15.5)
WBC: 13.2 10*3/uL — ABNORMAL HIGH (ref 4.0–10.5)
WBC: 13.9 10*3/uL — AB (ref 4.0–10.5)

## 2013-10-25 LAB — BASIC METABOLIC PANEL
ANION GAP: 9 (ref 5–15)
BUN: 9 mg/dL (ref 6–23)
CO2: 28 mEq/L (ref 19–32)
CREATININE: 0.8 mg/dL (ref 0.50–1.10)
Calcium: 8.4 mg/dL (ref 8.4–10.5)
Chloride: 100 mEq/L (ref 96–112)
GFR calc non Af Amer: 75 mL/min — ABNORMAL LOW (ref >90)
GFR, EST AFRICAN AMERICAN: 86 mL/min — AB (ref >90)
Glucose, Bld: 86 mg/dL (ref 70–99)
Potassium: 3.3 mEq/L — ABNORMAL LOW (ref 3.7–5.3)
Sodium: 137 mEq/L (ref 137–147)

## 2013-10-25 LAB — HEPARIN LEVEL (UNFRACTIONATED)
HEPARIN UNFRACTIONATED: 0.28 [IU]/mL — AB (ref 0.30–0.70)
Heparin Unfractionated: 0.58 IU/mL (ref 0.30–0.70)

## 2013-10-25 MED ORDER — CETYLPYRIDINIUM CHLORIDE 0.05 % MT LIQD
7.0000 mL | Freq: Two times a day (BID) | OROMUCOSAL | Status: DC
  Administered 2013-10-26 – 2013-11-01 (×13): 7 mL via OROMUCOSAL

## 2013-10-25 MED ORDER — IOHEXOL 300 MG/ML  SOLN: 50.0000 mL | Freq: Once | INTRAMUSCULAR | Status: AC | PRN

## 2013-10-25 MED ORDER — FENTANYL CITRATE 0.05 MG/ML IJ SOLN
INTRAMUSCULAR | Status: AC
  Filled 2013-10-25: qty 2

## 2013-10-25 MED ORDER — CHLORHEXIDINE GLUCONATE 0.12 % MT SOLN
15.0000 mL | Freq: Two times a day (BID) | OROMUCOSAL | Status: DC
  Administered 2013-10-25 – 2013-10-26 (×2): 15 mL via OROMUCOSAL
  Filled 2013-10-25: qty 15

## 2013-10-25 MED ORDER — MIDAZOLAM HCL 2 MG/2ML IJ SOLN
INTRAMUSCULAR | Status: AC
  Filled 2013-10-25: qty 2

## 2013-10-25 MED ORDER — HEPARIN (PORCINE) IN NACL 100-0.45 UNIT/ML-% IJ SOLN
1850.0000 [IU]/h | INTRAMUSCULAR | Status: AC
  Administered 2013-10-25 – 2013-10-27 (×4): 1850 [IU]/h via INTRAVENOUS
  Filled 2013-10-25 (×8): qty 250

## 2013-10-25 MED ORDER — POTASSIUM CHLORIDE 10 MEQ/100ML IV SOLN
10.0000 meq | INTRAVENOUS | Status: AC
  Administered 2013-10-25 (×2): 10 meq via INTRAVENOUS
  Filled 2013-10-25 (×2): qty 100

## 2013-10-25 NOTE — Progress Notes (Signed)
Pt placed pt back on Bipap per pt request. Attempted to use 100% NRB but SpO2 was in mid 80s. Pt tolerating Bipap well at this time. Vitals WNL

## 2013-10-25 NOTE — Progress Notes (Signed)
Pt tolerating HFNC well at this time. Bipap not needed. RT will continue to monitor.

## 2013-10-25 NOTE — Sedation Documentation (Signed)
Pressures remain elevated. Both wires and sheaths removed. Heparin stopped.

## 2013-10-25 NOTE — Progress Notes (Signed)
Subjective: Saddle PE thrombolysis started 8/22 Doing well today 02 st 90-96% Bipap   Objective: Vital signs in last 24 hours: Temp:  [97.7 F (36.5 C)-100 F (37.8 C)] 97.7 F (36.5 C) (08/23 0726) Pulse Rate:  [97-113] 103 (08/23 0800) Resp:  [21-42] 40 (08/23 0800) BP: (91-139)/(32-88) 139/62 mmHg (08/23 0800) SpO2:  [75 %-100 %] 89 % (08/23 0800) FiO2 (%):  [60 %-100 %] 60 % (08/23 0400) Weight:  [77.2 kg (170 lb 3.1 oz)] 77.2 kg (170 lb 3.1 oz) (08/23 0500) Last BM Date: 10/17/13  Intake/Output from previous day: 08/22 0701 - 08/23 0700 In: 3340.2 [I.V.:2290.2; IV Piggyback:925] Out: 1075 [Urine:1075] Intake/Output this shift: Total I/O In: 119.3 [I.V.:119.3] Out: -   PE:  Afeb; vss Follows all commands Rt groin NT; no bleeding; no hematoma Lines intact H/H stable Bun/Cr stable    Lab Results:   Recent Labs  10/24/13 2220 10/25/13 0415  WBC 14.1* 13.2*  HGB 8.6* 8.3*  HCT 25.0* 24.4*  PLT 255 243   BMET  Recent Labs  10/24/13 0315 10/25/13 0415  NA 135* 137  K 3.6* 3.3*  CL 94* 100  CO2 26 28  GLUCOSE 134* 86  BUN 6 9  CREATININE 0.81 0.80  CALCIUM 9.1 8.4   PT/INR  Recent Labs  10/23/13 2011  LABPROT 15.8*  INR 1.26   ABG No results found for this basename: PHART, PCO2, PO2, HCO3,  in the last 72 hours  Studies/Results: Ir Angiogram Pulmonary Bilateral Selective  10/24/2013   CLINICAL DATA:  Sub massive bilateral pulmonary emboli, tachypnea, respiratory distress, right heart strain  EXAM: ULTRASOUND GUIDANCE FOR VASCULAR ACCESS  BILATERAL PULMONARY ARTERIES CATHETERIZATIONS AND ANGIOGRAM  INITIATION BILATERAL TRANSCATHETER THROMBOLYTIC ULTRASOUND ASSISTED INFUSION  Date:  8/22/20158/22/2015 11:24 am; 10/24/2013 11:23 am  Radiologist:  Judie Petit. Ruel Favors, MD  Guidance:  Ultrasound and fluoroscopic  FLUOROSCOPY TIME:  7 min 24 seconds  MEDICATIONS AND MEDICAL HISTORY: 1.5 mg Versed, 62.5 mcg fentanyl  ANESTHESIA/SEDATION: 30 min   CONTRAST:  20mL OMNIPAQUE IOHEXOL 300 MG/ML  SOLN  COMPLICATIONS: No immediate  PROCEDURE: Informed consent was obtained from the patient following explanation of the procedure, risks, benefits and alternatives. The patient understands, agrees and consents for the procedure. All questions were addressed. A time out was performed.  Maximal barrier sterile technique utilized including caps, mask, sterile gowns, sterile gloves, large sterile drape, hand hygiene, and Betadine.  Under sterile conditions and local anesthesia, ultrasound micropuncture access was performed of the right common femoral vein. Micro guidewire inserted. Transitional dilator advanced followed by a Bentson guidewire. Six French sheath inserted. In a similar fashion, a second right common femoral vein 6 French sheath was placed adjacent to the first sheath. Bentson guidewire and Kumpe catheter were advanced superiorly into the main left pulmonary artery. Left pulmonary angiogram performed as well as pressure measurement.  Left pulmonary artery pressure: 46/ 22 (32)  Left pulmonary angiogram: Large central filling defect noted compatible with known acute emboli.  Rosen guidewire advanced into the left lower lobe pulmonary arteries.  Through the second sheath, a Kumpe catheter and a Bentson guidewire were advanced superiorly into the peripheral right lower lobe pulmonary artery. Contrast injection confirms pulmonary arterial location. A second Rosen guidewire was inserted. Over the [facility] guidewires, EKOS ultrasound assisted infusion catheters were advanced over the Rosen guidewires into the pulmonary arteries bilaterally. Left pulmonary artery infusion catheter has a 12 cm infusion length and the right pulmonary artery infusion catheter has an 18  cm infusion length. Images obtained for documentation. Catheters secured at the right groin access site. Patient tolerated the procedure well. No immediate complication.  Pulmonary artery thrombolytic  infusion will be initiated at 1 milligram/hour over 12 hr through each catheter, total dose 24 mg tPA.  IMPRESSION: Successful ultrasound and fluoroscopically inserted bilateral EKOS ultrasound assisted infusion catheters into the pulmonary arteries for pulmonary emboli thrombolysis.   Electronically Signed   By: Ruel Favors M.D.   On: 10/24/2013 11:44   Ir Angiogram Selective Each Additional Vessel  10/24/2013   CLINICAL DATA:  Sub massive bilateral pulmonary emboli, tachypnea, respiratory distress, right heart strain  EXAM: ULTRASOUND GUIDANCE FOR VASCULAR ACCESS  BILATERAL PULMONARY ARTERIES CATHETERIZATIONS AND ANGIOGRAM  INITIATION BILATERAL TRANSCATHETER THROMBOLYTIC ULTRASOUND ASSISTED INFUSION  Date:  8/22/20158/22/2015 11:24 am; 10/24/2013 11:23 am  Radiologist:  M. Ruel Favors, MD  Guidance:  Ultrasound and fluoroscopic  FLUOROSCOPY TIME:  7 min 24 seconds  MEDICATIONS AND MEDICAL HISTORY: 1.5 mg Versed, 62.5 mcg fentanyl  ANESTHESIA/SEDATION: 30 min  CONTRAST:  20mL OMNIPAQUE IOHEXOL 300 MG/ML  SOLN  COMPLICATIONS: No immediate  PROCEDURE: Informed consent was obtained from the patient following explanation of the procedure, risks, benefits and alternatives. The patient understands, agrees and consents for the procedure. All questions were addressed. A time out was performed.  Maximal barrier sterile technique utilized including caps, mask, sterile gowns, sterile gloves, large sterile drape, hand hygiene, and Betadine.  Under sterile conditions and local anesthesia, ultrasound micropuncture access was performed of the right common femoral vein. Micro guidewire inserted. Transitional dilator advanced followed by a Bentson guidewire. Six French sheath inserted. In a similar fashion, a second right common femoral vein 6 French sheath was placed adjacent to the first sheath. Bentson guidewire and Kumpe catheter were advanced superiorly into the main left pulmonary artery. Left pulmonary angiogram  performed as well as pressure measurement.  Left pulmonary artery pressure: 46/ 22 (32)  Left pulmonary angiogram: Large central filling defect noted compatible with known acute emboli.  Rosen guidewire advanced into the left lower lobe pulmonary arteries.  Through the second sheath, a Kumpe catheter and a Bentson guidewire were advanced superiorly into the peripheral right lower lobe pulmonary artery. Contrast injection confirms pulmonary arterial location. A second Rosen guidewire was inserted. Over the [facility] guidewires, EKOS ultrasound assisted infusion catheters were advanced over the Rosen guidewires into the pulmonary arteries bilaterally. Left pulmonary artery infusion catheter has a 12 cm infusion length and the right pulmonary artery infusion catheter has an 18 cm infusion length. Images obtained for documentation. Catheters secured at the right groin access site. Patient tolerated the procedure well. No immediate complication.  Pulmonary artery thrombolytic infusion will be initiated at 1 milligram/hour over 12 hr through each catheter, total dose 24 mg tPA.  IMPRESSION: Successful ultrasound and fluoroscopically inserted bilateral EKOS ultrasound assisted infusion catheters into the pulmonary arteries for pulmonary emboli thrombolysis.   Electronically Signed   By: Ruel Favors M.D.   On: 10/24/2013 11:44   Ir US Guide Vasc Access Right  10/24/2013   CLINICAL DATA:  Sub massive bilateral pulmonary emboli, tachypnea, respiratory distress, right heart strain  EXAM: ULTRASOUND GUIDANCE FOR VASCULAR ACCESS  BILATERAL PULMONARY ARTERIES CATHETERIZATIONS AND ANGIOGRAM  INITIATION BILATERAL TRANSCATHETER THROMBOLYTIC ULTRASOUND ASSISTED INFUSION  Date:  8/22/20158/22/2015 11:24 am; 10/24/2013 11:23 am  Radiologist:  M. Ruel Favors, MD  Guidance:  Ultrasound and fluoroscopic  FLUOROSCOPY TIME:  7 min 24 seconds  MEDICATIONS AND MEDICAL HISTORY: 1.5 mg Versed,  62.5 mcg fentanyl  ANESTHESIA/SEDATION: 30 min   CONTRAST:  20mL OMNIPAQUE IOHEXOL 300 MG/ML  SOLN  COMPLICATIONS: No immediate  PROCEDURE: Informed consent was obtained from the patient following explanation of the procedure, risks, benefits and alternatives. The patient understands, agrees and consents for the procedure. All questions were addressed. A time out was performed.  Maximal barrier sterile technique utilized including caps, mask, sterile gowns, sterile gloves, large sterile drape, hand hygiene, and Betadine.  Under sterile conditions and local anesthesia, ultrasound micropuncture access was performed of the right common femoral vein. Micro guidewire inserted. Transitional dilator advanced followed by a Bentson guidewire. Six French sheath inserted. In a similar fashion, a second right common femoral vein 6 French sheath was placed adjacent to the first sheath. Bentson guidewire and Kumpe catheter were advanced superiorly into the main left pulmonary artery. Left pulmonary angiogram performed as well as pressure measurement.  Left pulmonary artery pressure: 46/ 22 (32)  Left pulmonary angiogram: Large central filling defect noted compatible with known acute emboli.  Rosen guidewire advanced into the left lower lobe pulmonary arteries.  Through the second sheath, a Kumpe catheter and a Bentson guidewire were advanced superiorly into the peripheral right lower lobe pulmonary artery. Contrast injection confirms pulmonary arterial location. A second Rosen guidewire was inserted. Over the [facility] guidewires, EKOS ultrasound assisted infusion catheters were advanced over the Rosen guidewires into the pulmonary arteries bilaterally. Left pulmonary artery infusion catheter has a 12 cm infusion length and the right pulmonary artery infusion catheter has an 18 cm infusion length. Images obtained for documentation. Catheters secured at the right groin access site. Patient tolerated the procedure well. No immediate complication.  Pulmonary artery thrombolytic  infusion will be initiated at 1 milligram/hour over 12 hr through each catheter, total dose 24 mg tPA.  IMPRESSION: Successful ultrasound and fluoroscopically inserted bilateral EKOS ultrasound assisted infusion catheters into the pulmonary arteries for pulmonary emboli thrombolysis.   Electronically Signed   By: Ruel Favors M.D.   On: 10/24/2013 11:44   Dg Chest Port 1 View  10/25/2013   CLINICAL DATA:  Infiltrates  EXAM: PORTABLE CHEST - 1 VIEW  COMPARISON:  Portable chest x-ray of October 23, 2013  FINDINGS: The lungs are adequately inflated. The interstitial markings remain increased bilaterally with areas of confluence. TheEKOS infusion catheters are in place as seen on fluoro spot images of August 22nd. The left hemidiaphragm remains obscured. The cardiac silhouette is normal in size. The pulmonary vascularity is indistinct.  IMPRESSION: Increased interstitial markings with areas of confluence persist bilaterally. The pulmonary vascularity remains indistinct. Overall there has not been significant change since a study of August 21st. Sign report   Electronically Signed   By: David  [place]   On: 10/25/2013 07:16   Dg Chest Port 1 View  10/23/2013   CLINICAL DATA:  Hypoxia  EXAM: PORTABLE CHEST - 1 VIEW  COMPARISON:  None.  FINDINGS: Borderline cardiomegaly. Bilateral small pleural effusion. There is hazy airspace opacity in lingula. Streaky infiltrate noted in right upper lobe peripheral. Findings suspicious for multifocal pneumonia. No convincing pulmonary edema.  IMPRESSION: Bilateral small pleural effusion. There is hazy airspace opacity in lingula. Streaky infiltrate noted in right upper lobe peripheral. Findings suspicious for multifocal pneumonia. No convincing pulmonary edema.   Electronically Signed   By: Natasha Mead M.D.   On: 10/23/2013 19:03   Ir Infusion Thrombol Arterial Initial (ms)  10/24/2013   CLINICAL DATA:  Sub massive bilateral pulmonary emboli, tachypnea, respiratory distress,  right heart strain  EXAM: ULTRASOUND GUIDANCE FOR VASCULAR ACCESS  BILATERAL PULMONARY ARTERIES CATHETERIZATIONS AND ANGIOGRAM  INITIATION BILATERAL TRANSCATHETER THROMBOLYTIC ULTRASOUND ASSISTED INFUSION  Date:  8/22/20158/22/2015 11:24 am; 10/24/2013 11:23 am  Radiologist:  M. Ruel Favors, MD  Guidance:  Ultrasound and fluoroscopic  FLUOROSCOPY TIME:  7 min 24 seconds  MEDICATIONS AND MEDICAL HISTORY: 1.5 mg Versed, 62.5 mcg fentanyl  ANESTHESIA/SEDATION: 30 min  CONTRAST:  20mL OMNIPAQUE IOHEXOL 300 MG/ML  SOLN  COMPLICATIONS: No immediate  PROCEDURE: Informed consent was obtained from the patient following explanation of the procedure, risks, benefits and alternatives. The patient understands, agrees and consents for the procedure. All questions were addressed. A time out was performed.  Maximal barrier sterile technique utilized including caps, mask, sterile gowns, sterile gloves, large sterile drape, hand hygiene, and Betadine.  Under sterile conditions and local anesthesia, ultrasound micropuncture access was performed of the right common femoral vein. Micro guidewire inserted. Transitional dilator advanced followed by a Bentson guidewire. Six French sheath inserted. In a similar fashion, a second right common femoral vein 6 French sheath was placed adjacent to the first sheath. Bentson guidewire and Kumpe catheter were advanced superiorly into the main left pulmonary artery. Left pulmonary angiogram performed as well as pressure measurement.  Left pulmonary artery pressure: 46/ 22 (32)  Left pulmonary angiogram: Large central filling defect noted compatible with known acute emboli.  Rosen guidewire advanced into the left lower lobe pulmonary arteries.  Through the second sheath, a Kumpe catheter and a Bentson guidewire were advanced superiorly into the peripheral right lower lobe pulmonary artery. Contrast injection confirms pulmonary arterial location. A second Rosen guidewire was inserted. Over the [facility]  guidewires, EKOS ultrasound assisted infusion catheters were advanced over the Rosen guidewires into the pulmonary arteries bilaterally. Left pulmonary artery infusion catheter has a 12 cm infusion length and the right pulmonary artery infusion catheter has an 18 cm infusion length. Images obtained for documentation. Catheters secured at the right groin access site. Patient tolerated the procedure well. No immediate complication.  Pulmonary artery thrombolytic infusion will be initiated at 1 milligram/hour over 12 hr through each catheter, total dose 24 mg tPA.  IMPRESSION: Successful ultrasound and fluoroscopically inserted bilateral EKOS ultrasound assisted infusion catheters into the pulmonary arteries for pulmonary emboli thrombolysis.   Electronically Signed   By: Ruel Favors M.D.   On: 10/24/2013 11:44    Anti-infectives: Anti-infectives   Start     Dose/Rate Route Frequency Ordered Stop   10/24/13 0900  vancomycin (VANCOCIN) IVPB 1000 mg/200 mL premix     1,000 mg 200 mL/hr over 60 Minutes Intravenous Every 12 hours 10/24/13 0749     10/24/13 0800  ceFEPIme (MAXIPIME) 1 g in dextrose 5 % 50 mL IVPB     1 g 100 mL/hr over 30 Minutes Intravenous 3 times per day 10/24/13 0749     10/24/13 0000  azithromycin (ZITHROMAX) 250 mg in dextrose 5 % 125 mL IVPB     250 mg 125 mL/hr over 60 Minutes Intravenous Every 24 hours 10/23/13 2015 10/27/13 2359   10/23/13 2030  azithromycin (ZITHROMAX) 500 mg in dextrose 5 % 250 mL IVPB  Status:  Discontinued     500 mg 250 mL/hr over 60 Minutes Intravenous  Once 10/23/13 2015 10/23/13 2037   10/23/13 2030  vancomycin (VANCOCIN) IVPB 1000 mg/200 mL premix     1,000 mg 200 mL/hr over 60 Minutes Intravenous STAT 10/23/13 2021 10/23/13 2153   10/23/13 2030  ceFEPIme (  MAXIPIME) 2 g in dextrose 5 % 50 mL IVPB     2 g 100 mL/hr over 30 Minutes Intravenous STAT 10/23/13 2021 10/23/13 2128      Assessment/Plan: s/p * No surgery found *  Saddle PE lysis  ongoing For recheck this am Doing well   LOS: 2 days    Starsha Morning A 10/25/2013

## 2013-10-25 NOTE — Progress Notes (Signed)
  Echocardiogram 2D Echocardiogram has been performed.  Christy Short FRANCES 10/25/2013, 5:03 PM

## 2013-10-25 NOTE — Progress Notes (Signed)
[facility] ADULT ICU REPLACEMENT PROTOCOL FOR AM LAB REPLACEMENT ONLY  The patient does apply for the [facility] Adult ICU Electrolyte Replacment Protocol based on the criteria listed below:   1. Is GFR >/= 40 ml/min? Yes.    Patient's GFR today is 75 2. Is urine output >/= 0.5 ml/kg/hr for the last 6 hours? Yes.   Patient's UOP is 0.88 ml/kg/hr 3. Is BUN < 60 mg/dL? Yes.    Patient's BUN today is 9 4. Abnormal electrolyte(s): K+ 3.3 5. Ordered repletion with: Per protocol 6. If a panic level lab has been reported, has the CCM MD in charge been notified? Yes.  .   Physician:  Dr. Morene Antu, Alda Berthold E 10/25/2013 5:30 AM

## 2013-10-25 NOTE — Progress Notes (Signed)
ANTICOAGULATION CONSULT NOTE - Follow Up Consult  Pharmacy Consult for Heparin  Indication: pulmonary embolus  No Known Allergies  Patient Measurements: Height:  (162.6 cm) Weight: 167 lb 1.7 oz (75.8 kg) IBW/kg (Calculated) : 54.7  Vital Signs: Temp: 98.7 F (37.1 C) (08/23 0400) Temp src: Oral (08/23 0400) BP: 125/46 mmHg (08/23 0400) Pulse Rate: 101 (08/23 0400)  Labs:  Recent Labs  10/23/13 2011 10/23/13 2015 10/24/13 0315 10/24/13 0842 10/24/13 1710 10/24/13 2220 10/25/13 0415  HGB 9.3*  --  10.1*  --  9.0* 8.6* 8.3*  HCT 27.4*  --  29.8*  --  26.4* 25.0* 24.4*  PLT 285  --  306  --  282 255 243  LABPROT 15.8*  --   --   --   --   --   --   INR 1.26  --   --   --   --   --   --   HEPARINUNFRC  --   --  0.25*  --  0.24* 0.26* 0.28*  CREATININE 0.72  --  0.81  --   --   --   --   TROPONINI  --  <0.30 <0.30 <0.30  --   --   --     Assessment: Sub-therapeutic heparin level on 1700 units/hr of heparin. Other labs as above. Alteplase still infusing until ~1300 today. No issues per RN.   Goal of Therapy:  Heparin level 0.3-0.7 units/ml Monitor platelets by anticoagulation protocol: Yes   Plan:  -Increase heparin drip to 1850 units/hr -1100 HL -Daily CBC/HL -Monitor for bleeding  Abran Duke 10/25/2013,4:47 AM

## 2013-10-25 NOTE — Progress Notes (Signed)
PULMONARY / CRITICAL CARE MEDICINE HISTORY AND PHYSICAL EXAMINATION   Name: Christy Short MRN: [id number] DOB: 1945/09/08    ADMISSION DATE:  10/23/2013  PRIMARY SERVICE: PCCM  CHIEF COMPLAINT:  SOB   BRIEF PATIENT DESCRIPTION: 50 F with submassive PE transferred from [facility] in setting of worsening hypoxemia.  SIGNIFICANT EVENTS / STUDIES:  CT ([facility]) 8/18: PE present, other details not available   Lower Extremity Doppler 8/19 (+) DVT  Echocardiogram on 10/20/2013 which noted an EF of > 70%, a moderately dilated RV, and moderately reduced RV function.  Repeat CTA 8/21: Large volume embolus including small saddle PE, evidence of RH strain, RV/LV ratio 1.1  LINES / TUBES: R groin sheath 8/22>>>  CULTURES: BCx2 8/21>>> Urine 8/21>>>  ANTIBIOTICS: Vanc 8/21 - Cefepmine 8/21 - Azithro 8/21 -   SUBJECTIVE:  Intermittent worsening hypoxia overnight.  Now improved on bipap.   VITAL SIGNS: Temp:  [97.7 F (36.5 C)-100 F (37.8 C)] 97.7 F (36.5 C) (08/23 0726) Pulse Rate:  [97-113] 103 (08/23 0800) Resp:  [21-42] 40 (08/23 0800) BP: (91-139)/(32-88) 139/62 mmHg (08/23 0800) SpO2:  [75 %-100 %] 89 % (08/23 0800) FiO2 (%):  [60 %-100 %] 60 % (08/23 0400) Weight:  [170 lb 3.1 oz (77.2 kg)] 170 lb 3.1 oz (77.2 kg) (08/23 0500) HEMODYNAMICS:   VENTILATOR SETTINGS: Vent Mode:  [-] BIPAP FiO2 (%):  [60 %-100 %] 60 % Set Rate:  [18 bmp] 18 bmp PEEP:  [6 cmH20] 6 cmH20 INTAKE / OUTPUT: Intake/Output     08/22 0701 - 08/23 0700 08/23 0701 - 08/24 0700   I.V. (mL/kg) 2290.2 (29.7) 119.3 (1.5)   Other 125    IV Piggyback 925    Total Intake(mL/kg) 3340.2 (43.3) 119.3 (1.5)   Urine (mL/kg/hr) 1075 (0.6)    Total Output 1075     Net +2265.2 +119.3          PHYSICAL EXAMINATION: General:  Elderly F NAD on bipap  Neuro:  Drowsy, arousable, MAE HEENT:  Sclera anicteric, conjunctiva pink, MMM, OP clear Neck: Supple and midline trachea  Cardiovascular:  Mild  tachy, RR, NS1/S2, (-) MRG Lungs:  resps even non labored on bipap, diminished bases, few scattered crackles  Abdomen:  S/NT/ND/(+)BS Musculoskeletal:  1+ pitting edema to knees bilaterally, RLE cast, R groin sheath  Skin:  (-) Rash  LABS:  CBC  Recent Labs Lab 10/24/13 1710 10/24/13 2220 10/25/13 0415  WBC 14.1* 14.1* 13.2*  HGB 9.0* 8.6* 8.3*  HCT 26.4* 25.0* 24.4*  PLT 282 255 243   Coag's  Recent Labs Lab 10/23/13 2011  INR 1.26   BMET  Recent Labs Lab 10/23/13 2011 10/24/13 0315 10/25/13 0415  NA 135* 135* 137  K 3.9 3.6* 3.3*  CL 99 94* 100  CO2 BUN 5* 6 9  CREATININE 0.72 0.81 0.80  GLUCOSE 102* 134* 86   Electrolytes  Recent Labs Lab 10/23/13 2011 10/24/13 0315 10/25/13 0415  CALCIUM 8.8 9.1 8.4  MG 1.9 1.9  --   PHOS 1.4*  --   --    Sepsis Markers  Recent Labs Lab 10/23/13 2130  LATICACIDVEN 1.5   ABG No results found for this basename: PHART, PCO2ART, PO2ART,  in the last 168 hours Liver Enzymes  Recent Labs Lab 10/23/13 2011  AST 37  ALT 41*  ALKPHOS 214*  BILITOT 0.7  ALBUMIN 2.5*   Cardiac Enzymes  Recent Labs Lab 10/23/13 2015 10/24/13 0315 10/24/13 1324  TROPONINI <0.30 <0.30 <0.30  PROBNP 3759.0*  --   --    Glucose  Recent Labs Lab 10/23/13 1824  GLUCAP 120*    Imaging CXR 8/23-  No sig change  ASSESSMENT / PLAN:  PULMONARY A: Acute Hypoxic Resp Failure: worsening hypoxia. ?Due to shifting clot vs pulm infarcts Submassive PE - s/p catheter directed lysis 8/22 ?HCAP P:   Cont heparin gtt  Catheter directed lysis per IR --> for catheter removal 8/23 High flow O2  F/u CXR  abx as below  Continue bipap - trial off once returns from IR    CARDIOVASCULAR A: HTN HL P:   Hold Antihypertensives Stat 2D echo in setting PE -- poor images with EKOS system in place - retry 8/23 Continue Home Statin   RENAL Hyponatremia - resolved Hypokalemia  P:   F/u chem  Replete K  PRN  GASTROINTESTINAL A: No acute issues NPO on bipap   HEMATOLOGIC A: PE/DVT P:  Anti-coagulation as above  F/u cbc  Heparin gtt   INFECTIOUS A: RO HCAP:  P:   F/u cultures  Continue empiric Vanc/Cefepime/Azithro  ENDOCRINE A: Hx Hyperparathyroidism  P:   Monitor Ca  NEUROLOGIC A: Depression:  P:   Cont Home Meds   TODAY'S SUMMARY:  Submassive PE s/p catheter directed lysis.  Hypoxemia/WOB improved on bipap.  Trial off once she returns from IR for catheter removal.  Tenuous but improving.  Continue heparin gtt, empiric abx for ?HCAP.     CRITICAL CARE: The patient is critically ill with multiple organ systems failure and requires high complexity decision making for assessment and support, frequent evaluation and titration of therapies, application of advanced monitoring technologies and extensive interpretation of multiple databases. Critical Care Time devoted to patient care services described in this note is ____ minutes.   Dirk Dress, NP 10/25/2013  9:09 AM Pager: [phone] or [phone]  *Care during the described time interval was provided by me and/or other providers on the critical care team. I have reviewed this patient's available data, including medical history, events of note, physical examination and test results as part of my evaluation.

## 2013-10-25 NOTE — Progress Notes (Signed)
ANTICOAGULATION CONSULT NOTE - Follow Up Consult  Pharmacy Consult for Heparin  Indication: pulmonary embolus  No Known Allergies  Patient Measurements: Height:  (162.6 cm) Weight: 170 lb 3.1 oz (77.2 kg) IBW/kg (Calculated) : 54.7  Vital Signs: Temp: 99.1 F (37.3 C) (08/23 2012) Temp src: Oral (08/23 2012) BP: 123/53 mmHg (08/23 2012) Pulse Rate: 108 (08/23 2012)  Labs:  Recent Labs  10/23/13 2011 10/23/13 2015 10/24/13 0315 10/24/13 0842  10/24/13 2220 10/25/13 0415 10/25/13 1358 10/25/13 1949  HGB 9.3*  --  10.1*  --   < > 8.6* 8.3* 8.7*  --   HCT 27.4*  --  29.8*  --   < > 25.0* 24.4* 25.8*  --   PLT 285  --  306  --   < > 255 243 266  --   LABPROT 15.8*  --   --   --   --   --   --   --   --   INR 1.26  --   --   --   --   --   --   --   --   HEPARINUNFRC  --   --  0.25*  --   < > 0.26* 0.28*  --  0.58  CREATININE 0.72  --  0.81  --   --   --  0.80  --   --   TROPONINI  --  <0.30 <0.30 <0.30  --   --   --   --   --   < > = values in this interval not displayed.  Assessment: 67 YOF on IV heparin for saddle PE s/p catheter-directed lysis x24hr. Heparin was off for cath removal and resumed at 1300 today at rate of 1850 units/hr.   Heparin level is therapeutic at 0.58.   Goal of Therapy:  Heparin level 0.3-0.7 units/ml Monitor platelets by anticoagulation protocol: Yes   Plan:  -Continue heparin at 1850 units/hr.  -Recheck heparin level in 6 hours to confirm- ok with AM labs.  -Daily CBC/HL -Monitor for bleeding  Link Snuffer, PharmD, BCPS Clinical Pharmacist [phone] 10/25/2013,8:27 PM

## 2013-10-25 NOTE — Progress Notes (Signed)
Pt transported to IR on 100% NRB. Upon arrival, Pt c/o not being able to breathe, and O2 sats were around 83%. Pt placed back on BIPAP. Pt c/o not feeling like she's getting enough air, increased EPAP to 8. Current settings now 18/8 RR 18 100%. Will keep pt on BIPAP during procedure, will attempt to wean as tolerated once returned to ICU. Vitals are currently within normal limits. RT will continue to monitor.

## 2013-10-25 NOTE — Progress Notes (Signed)
Pt placed on HFNC 45L/50%. Pt c/o of not liking HFNC, says the flow feels too warm for her. Pt requesting to go back on BIPAP at this time. O2 sats are staying around 90-91% on current HFNC settings. Per MD, try to wean off BIPAP if possible. RT will attempt to keep pt on HFNC at this time to give a break from BIPAP. RT will continue to monitor.

## 2013-10-25 NOTE — Sedation Documentation (Signed)
No sedation used for removal

## 2013-10-26 ENCOUNTER — Inpatient Hospital Stay (HOSPITAL_COMMUNITY): Payer: Medicare Other

## 2013-10-26 DIAGNOSIS — J9621 Acute and chronic respiratory failure with hypoxia: Secondary | ICD-10-CM | POA: Diagnosis present

## 2013-10-26 DIAGNOSIS — J9600 Acute respiratory failure, unspecified whether with hypoxia or hypercapnia: Secondary | ICD-10-CM

## 2013-10-26 DIAGNOSIS — J90 Pleural effusion, not elsewhere classified: Secondary | ICD-10-CM

## 2013-10-26 DIAGNOSIS — R918 Other nonspecific abnormal finding of lung field: Secondary | ICD-10-CM

## 2013-10-26 LAB — BASIC METABOLIC PANEL
Anion gap: 11 (ref 5–15)
BUN: 10 mg/dL (ref 6–23)
CHLORIDE: 95 meq/L — AB (ref 96–112)
CO2: 27 mEq/L (ref 19–32)
CREATININE: 0.69 mg/dL (ref 0.50–1.10)
Calcium: 8.5 mg/dL (ref 8.4–10.5)
GFR calc non Af Amer: 88 mL/min — ABNORMAL LOW (ref >90)
GLUCOSE: 97 mg/dL (ref 70–99)
Potassium: 3.2 mEq/L — ABNORMAL LOW (ref 3.7–5.3)
Sodium: 133 mEq/L — ABNORMAL LOW (ref 137–147)

## 2013-10-26 LAB — URINE CULTURE: Colony Count: 3000

## 2013-10-26 LAB — CBC
HEMATOCRIT: 24.8 % — AB (ref 36.0–46.0)
HEMOGLOBIN: 8.5 g/dL — AB (ref 12.0–15.0)
MCH: 29.3 pg (ref 26.0–34.0)
MCHC: 34.3 g/dL (ref 30.0–36.0)
MCV: 85.5 fL (ref 78.0–100.0)
Platelets: 307 10*3/uL (ref 150–400)
RBC: 2.9 MIL/uL — ABNORMAL LOW (ref 3.87–5.11)
RDW: 14.2 % (ref 11.5–15.5)
WBC: 13.8 10*3/uL — ABNORMAL HIGH (ref 4.0–10.5)

## 2013-10-26 LAB — VANCOMYCIN, TROUGH: Vancomycin Tr: 10.4 ug/mL (ref 10.0–20.0)

## 2013-10-26 LAB — HEPARIN LEVEL (UNFRACTIONATED): Heparin Unfractionated: 0.62 IU/mL (ref 0.30–0.70)

## 2013-10-26 LAB — FIBRINOGEN

## 2013-10-26 LAB — PHOSPHORUS: Phosphorus: 1.6 mg/dL — ABNORMAL LOW (ref 2.3–4.6)

## 2013-10-26 LAB — MAGNESIUM: Magnesium: 2.2 mg/dL (ref 1.5–2.5)

## 2013-10-26 MED ORDER — POTASSIUM CHLORIDE CRYS ER 20 MEQ PO TBCR
40.0000 meq | EXTENDED_RELEASE_TABLET | Freq: Two times a day (BID) | ORAL | Status: AC
  Administered 2013-10-26 (×2): 40 meq via ORAL
  Filled 2013-10-26 (×2): qty 2

## 2013-10-26 MED ORDER — POTASSIUM CHLORIDE CRYS ER 20 MEQ PO TBCR: 40.0000 meq | EXTENDED_RELEASE_TABLET | Freq: Once | ORAL | Status: DC

## 2013-10-26 NOTE — Progress Notes (Signed)
**Note De-Identified Whitman Meinhardt Obfuscation** Patient removed from BIPAP and placed on HFNC to eat. Patient tolerating well

## 2013-10-26 NOTE — Progress Notes (Signed)
Utilization review completed. Tharon Bomar, RN, BSN. 

## 2013-10-26 NOTE — Progress Notes (Addendum)
ANTICOAGULATION and ANTIMICROBIAL CONSULT NOTE  Pharmacy Consult for Heparin  Indication: pulmonary embolus  No Known Allergies  Patient Measurements: Height:  (162.6 cm) Weight: 173 lb 1 oz (78.5 kg) IBW/kg (Calculated) : 54.7  Vital Signs: Temp: 98.6 F (37 C) (08/24 0410) Temp src: Oral (08/24 0410) BP: 119/59 mmHg (08/24 0800) Pulse Rate: 98 (08/24 0800)  Labs:  Recent Labs  10/23/13 2011 10/23/13 2015 10/24/13 0315 10/24/13 0842  10/25/13 0415 10/25/13 1358 10/25/13 1949 10/26/13 0115  HGB 9.3*  --  10.1*  --   < > 8.3* 8.7*  --  8.5*  HCT 27.4*  --  29.8*  --   < > 24.4* 25.8*  --  24.8*  PLT 285  --  306  --   < > 243 266  --  307  LABPROT 15.8*  --   --   --   --   --   --   --   --   INR 1.26  --   --   --   --   --   --   --   --   HEPARINUNFRC  --   --  0.25*  --   < > 0.28*  --  0.58 0.62  CREATININE 0.72  --  0.81  --   --  0.80  --   --  0.69  TROPONINI  --  <0.30 <0.30 <0.30  --   --   --   --   --   < > = values in this interval not displayed.  Assessment: 68 year old female on IV heparin for saddle PE with RH strain s/p catheter-directed lysis x24hr.  EKOS process completed. Heparin was off for cath removal and resumed at 1300 on 8/23, heparin level now therapeutic x2 on 1850 units/hr.  Hgb low but stable, plts are wnl. No signs of bleeding.   Patient also continues on antibiotics for rule out HCAP.  Pt is afebrile, white count is mildly elevate, lactic acid wnl.  On day #4 of vancomycin/cefepime and day #3 azithromycin.  MRSA PCR negative, blood cultures and urine culture - no growth to date.  CXR showing pulmonary edema vs PNA.  Goal of Therapy:  Heparin level 0.3-0.7 units/ml Monitor platelets by anticoagulation protocol: Yes   Plan:  -Continue heparin at 1850 units/hr -Daily CBC/HL -Monitor for s/sx bleeding -F/u transition to PO anticoagulation -Recommend discontinuing vancomycin  -Continue cefepime 1 g/8h -Continue azithromycin 500  mg/24h   Agapito Games, PharmD, BCPS Clinical Pharmacist Pager: [phone] 10/26/2013 8:27 AM    Addendum Vancomycin trough returned subtherapeutic at 10.4; all antibiotics were discontinued, including vancomycin, shortly after.  Agapito Games 10/26/2013

## 2013-10-26 NOTE — Progress Notes (Signed)
PULMONARY / CRITICAL CARE MEDICINE HISTORY AND PHYSICAL EXAMINATION   Name: Christy Short MRN: [id number] DOB: 14-Dec-1945    ADMISSION DATE:  10/23/2013  PRIMARY SERVICE: PCCM  CHIEF COMPLAINT:  SOB   BRIEF PATIENT DESCRIPTION: 26 F with submassive PE transferred from [facility] in setting of worsening hypoxemia.  SIGNIFICANT EVENTS / STUDIES:  CT ([facility]) 8/18: PE present, other details not available   Lower Extremity Doppler 8/19 (+) DVT  Echocardiogram on 10/20/2013 which noted an EF of > 70%, a moderately dilated RV, and moderately reduced RV function.  CTA chest 8/21: Large volume embolus including small saddle PE, evidence of RH strain, RV/LV ratio 1.1 Echocardiogram 8/23: Left sdie normal. RV mildly dilated, RV syst function mildly reduced  LINES / TUBES: R groin sheath 8/22 >> 8/23  CULTURES: Urine 8/21 >> NEG Blood 8/21 >> NEG  ANTIBIOTICS: Vanc 8/21 >> 8/24 Cefepmine 8/21>> 8/24 Azithro 8/21 >> 8/24   SUBJECTIVE:   Tachypneic but no overt distress. Still with L subscapular pain   VITAL SIGNS: Temp:  [97.6 F (36.4 C)-99.3 F (37.4 C)] 98.6 F (37 C) (08/24 1159) Pulse Rate:  [98-108] 102 (08/24 1200) Resp:  [18-58] 48 (08/24 1200) BP: (97-150)/(33-106) 138/62 mmHg (08/24 1200) SpO2:  [86 %-100 %] 95 % (08/24 1200) FiO2 (%):  [50 %-65 %] 50 % (08/24 0800) Weight:  [78.5 kg (173 lb 1 oz)] 78.5 kg (173 lb 1 oz) (08/24 0500) HEMODYNAMICS:   VENTILATOR SETTINGS: Vent Mode:  [-] BIPAP FiO2 (%):  [50 %-65 %] 50 % Set Rate:  [18 bmp] 18 bmp INTAKE / OUTPUT: Intake/Output     08/23 0701 - 08/24 0700 08/24 0701 - 08/25 0700   I.V. (mL/kg) 1128.5 (14.4) 142.5 (1.8)   Other     IV Piggyback 675    Total Intake(mL/kg) 1803.5 (23) 142.5 (1.8)   Urine (mL/kg/hr) 1775 (0.9) 350 (0.7)   Total Output 1775 350   Net +28.5 -207.5          PHYSICAL EXAMINATION: General: NAD Neuro: no focal deficits HEENT: WNL Neck: no JVD noted Cardiovascular:reg, no  M Lungs: clear anteriorly, diminished L base Abdomen:  Soft, +BS Musculoskeletal:  RLE cast, no edema noted  LABS: I have reviewed all of today's lab results. Relevant abnormalities are discussed in the A/P section  CXR: NSC B as dz   ASSESSMENT / PLAN:  PULMONARY A: Acute Hypoxic Resp Failure Submassive PE - s/p catheter directed lysis 8/22 Bilateral pulm infiltrates - doubt infectious L pleural effusion by CT chest 8/22 P:   Cont heparin gtt  No further lytic therapy planned Wean O2 as tolerated for SpO2 > 90 % Cotn BiPAP as needed Upright CXR AM 8/25 - consider thoracentesis if substantial effusion persists  CARDIOVASCULAR A: HTN, controlled Hyperlipidemia P:   Holding antihypertensives Continue statin  RENAL Hyponatremia, mild Hypokalemia, recurrent P:   Monitor BMET intermittently Monitor I/Os Correct electrolytes as indicated  GASTROINTESTINAL A: No acute issues Begin diet  HEMATOLOGIC A: Anemia without acute bleeding P:  Monitor CBC intermittently Transfuse per usual ICU guidelines  INFECTIOUS A: Doubt HCAP P:   Monitor temp, WBC count Micro and abx as above  ENDOCRINE A: Hx Hyperparathyroidism  P:   Monitor Ca  NEUROLOGIC A: Depression P:   Cont Home Meds   TODAY'S SUMMARY:   Remains tenuous. Cont to monitor in ICU for now   Billy Fischer, MD ; [facility] [phone].  After 5:30 PM or weekends, call  [phone]       

## 2013-10-27 ENCOUNTER — Inpatient Hospital Stay (HOSPITAL_COMMUNITY): Payer: Medicare Other

## 2013-10-27 LAB — PROTEIN, TOTAL: TOTAL PROTEIN: 6.2 g/dL (ref 6.0–8.3)

## 2013-10-27 LAB — BODY FLUID CELL COUNT WITH DIFFERENTIAL
EOS FL: NONE SEEN %
Lymphs, Fluid: 18 %
MONOCYTE-MACROPHAGE-SEROUS FLUID: 10 % — AB (ref 50–90)
NEUTROPHIL FLUID: 72 % — AB (ref 0–25)
Total Nucleated Cell Count, Fluid: 1996 cu mm — ABNORMAL HIGH (ref 0–1000)

## 2013-10-27 LAB — CBC
HEMATOCRIT: 22.1 % — AB (ref 36.0–46.0)
Hemoglobin: 7.5 g/dL — ABNORMAL LOW (ref 12.0–15.0)
MCH: 29.4 pg (ref 26.0–34.0)
MCHC: 33.9 g/dL (ref 30.0–36.0)
MCV: 86.7 fL (ref 78.0–100.0)
PLATELETS: 452 10*3/uL — AB (ref 150–400)
RBC: 2.55 MIL/uL — AB (ref 3.87–5.11)
RDW: 14.5 % (ref 11.5–15.5)
WBC: 18.3 10*3/uL — AB (ref 4.0–10.5)

## 2013-10-27 LAB — PROTEIN, BODY FLUID: Total protein, fluid: 2.7 g/dL

## 2013-10-27 LAB — LACTATE DEHYDROGENASE, PLEURAL OR PERITONEAL FLUID: LD, Fluid: 367 U/L — ABNORMAL HIGH (ref 3–23)

## 2013-10-27 LAB — BASIC METABOLIC PANEL
ANION GAP: 13 (ref 5–15)
BUN: 11 mg/dL (ref 6–23)
CHLORIDE: 96 meq/L (ref 96–112)
CO2: 25 mEq/L (ref 19–32)
CREATININE: 0.68 mg/dL (ref 0.50–1.10)
Calcium: 9.2 mg/dL (ref 8.4–10.5)
GFR calc Af Amer: >90 mL/min (ref >90)
GFR calc non Af Amer: 89 mL/min — ABNORMAL LOW (ref >90)
Glucose, Bld: 80 mg/dL (ref 70–99)
Potassium: 4.1 mEq/L (ref 3.7–5.3)
Sodium: 134 mEq/L — ABNORMAL LOW (ref 137–147)

## 2013-10-27 LAB — HEPARIN LEVEL (UNFRACTIONATED)
HEPARIN UNFRACTIONATED: 0.37 [IU]/mL (ref 0.30–0.70)
HEPARIN UNFRACTIONATED: 0.49 [IU]/mL (ref 0.30–0.70)

## 2013-10-27 LAB — PROCALCITONIN: Procalcitonin: 0.66 ng/mL

## 2013-10-27 LAB — LACTATE DEHYDROGENASE: LDH: 432 U/L — ABNORMAL HIGH (ref 94–250)

## 2013-10-27 MED ORDER — METHYLPREDNISOLONE SODIUM SUCC 125 MG IJ SOLR
80.0000 mg | Freq: Two times a day (BID) | INTRAMUSCULAR | Status: DC
  Administered 2013-10-27 – 2013-10-30 (×6): 80 mg via INTRAVENOUS
  Filled 2013-10-27 (×8): qty 1.28

## 2013-10-27 MED ORDER — RIVAROXABAN 20 MG PO TABS: 20.0000 mg | ORAL_TABLET | Freq: Every day | ORAL | Status: DC

## 2013-10-27 MED ORDER — VANCOMYCIN HCL IN DEXTROSE 1-5 GM/200ML-% IV SOLN
1000.0000 mg | Freq: Three times a day (TID) | INTRAVENOUS | Status: DC
  Administered 2013-10-27 – 2013-10-29 (×5): 1000 mg via INTRAVENOUS
  Filled 2013-10-27 (×7): qty 200

## 2013-10-27 MED ORDER — RIVAROXABAN 15 MG PO TABS
15.0000 mg | ORAL_TABLET | Freq: Two times a day (BID) | ORAL | Status: DC
  Administered 2013-10-27 – 2013-11-02 (×12): 15 mg via ORAL
  Filled 2013-10-27 (×15): qty 1

## 2013-10-27 MED ORDER — LEVOFLOXACIN IN D5W 750 MG/150ML IV SOLN
750.0000 mg | INTRAVENOUS | Status: AC
  Administered 2013-10-27 – 2013-10-30 (×4): 750 mg via INTRAVENOUS
  Filled 2013-10-27 (×4): qty 150

## 2013-10-27 NOTE — Progress Notes (Signed)
PULMONARY / CRITICAL CARE MEDICINE HISTORY AND PHYSICAL EXAMINATION   Name: Christy Short MRN: [id number] DOB: [birth year]    ADMISSION DATE:  10/23/2013  PRIMARY SERVICE: PCCM  CHIEF COMPLAINT:  SOB   BRIEF PATIENT DESCRIPTION: 47 F with submassive PE transferred from [facility] in setting of worsening hypoxemia.  SIGNIFICANT EVENTS / STUDIES:  8/18 CT ([facility]): PE present, other details not available   8/19 Lower Extremity Doppler (+) DVT  8/18 Echocardiogram:  EF of > 70%, a moderately dilated RV, and moderately reduced RV function.  8/21 CTA chest: Large volume embolus including small saddle PE, evidence of RH strain, RV/LV ratio 1.1 8/23 Echocardiogram: Left sdie normal. RV mildly dilated, RV syst function mildly reduced 8/25 Moderate to large L pleural effusion on CXR. Still requiring NRB mask 8/25 L thoracentesis:  8/25 increasing bilateral pulm infiltrates - empiric steroids and abx ordered. Procalcitonin protocol initiated  LINES / TUBES: R groin sheath 8/22 >> 8/23  CULTURES: Urine 8/21 >> NEG Blood 8/21 >> NEG Sputum 8/25 >>  Blood 8/25 >>   ANTIBIOTICS: Vanc 8/21 >> 8/24 Cefepmine 8/21>> 8/24 Azithro 8/21 >> 8/24 Vanc 8/25 >>  Levofloxacin 8/25 >>    SUBJECTIVE:   Tachypnea persists. No new complaints  VITAL SIGNS: Temp:  [98.1 F (36.7 C)-98.8 F (37.1 C)] 98.1 F (36.7 C) (08/25 1300) Pulse Rate:  [94-109] 101 (08/25 1200) Resp:  [22-48] 27 (08/25 1200) BP: (91-147)/(40-114) 100/59 mmHg (08/25 1200) SpO2:  [85 %-100 %] 91 % (08/25 1200) FiO2 (%):  [50 %-80 %] 50 % (08/25 0800) HEMODYNAMICS:   VENTILATOR SETTINGS: Vent Mode:  [-] BIPAP FiO2 (%):  [50 %-80 %] 50 % Set Rate:  [18 bmp] 18 bmp INTAKE / OUTPUT: Intake/Output     08/24 0701 - 08/25 0700 08/25 0701 - 08/26 0700   I.V. (mL/kg) 684 (8.7) 124 (1.6)   IV Piggyback     Total Intake(mL/kg) 684 (8.7) 124 (1.6)   Urine (mL/kg/hr) 1360 (0.7) 205 (0.4)   Total Output 1360 205   Net  -676 -81        Stool Occurrence 1 x      PHYSICAL EXAMINATION: General: NAD Neuro: no focal deficits HEENT: WNL Neck: no JVD noted Cardiovascular:reg, no M Lungs: clear anteriorly, dull and diminished L base Abdomen:  Soft, +BS Musculoskeletal:  RLE cast, no edema noted  LABS: I have reviewed all of today's lab results. Relevant abnormalities are discussed in the A/P section  CXR: B as dz persists, moderate L pleural effusion   ASSESSMENT / PLAN:  PULMONARY A: Acute Hypoxic Resp Failure Submassive PE - s/p catheter directed lysis 8/22 Bilateral pulm infiltrates unclear etiology L pleural effusion P:   Cont heparin gtt  No further lytic therapy planned Wean O2 as tolerated for SpO2 > 90 % Cotn BiPAP as needed Empiric steroids initiated 8/25 Empiric abx initiated 8/25  CARDIOVASCULAR A: HTN, controlled Hyperlipidemia P:   Holding antihypertensives Continue statin  RENAL Hyponatremia, mild Hypokalemia, recurrent P:   Monitor BMET intermittently Monitor I/Os Correct electrolytes as indicated  GASTROINTESTINAL A: No acute issues Begin diet  HEMATOLOGIC A: Anemia without acute bleeding P:  Change heparin to rivaroxaban 8/25 Monitor CBC intermittently Transfuse per usual ICU guidelines  INFECTIOUS A: Extensive pulmonary infiltrates - possible PNA P:   Monitor temp, WBC count Micro and abx as above  ENDOCRINE A: Hx Hyperparathyroidism  P:   Monitor Ca  NEUROLOGIC A: Depression P:   Cont Home Meds  TODAY'S SUMMARY:   Remains tenuous. Cont to monitor in ICU for now   Billy Fischer, MD ; [facility] [phone].  After 5:30 PM or weekends, call [phone]

## 2013-10-27 NOTE — Progress Notes (Signed)
Heparin drip stopped at 1155 per Dr. Sung Amabile order for a planned thoracentesis at the bedside.  Jerelyn Trimarco, Sherrilee Gilles, RN

## 2013-10-27 NOTE — Procedures (Signed)
Thoracentesis Procedure Note  Pre-operative Diagnosis: left pleural effusion   Post-operative Diagnosis: same  Indications: dyspnea and diagnostics   Procedure Details  Consent: Informed consent was obtained. Risks of the procedure were discussed including: infection, bleeding, pain, pneumothorax.  Under sterile conditions the patient was positioned. Betadine solution and sterile drapes were utilized.  1% buffered lidocaine was used to anesthetize the pleural space  Space, which was identified via real time [place]. Fluid was obtained without any difficulties and minimal blood loss.  A dressing was applied to the wound and wound care instructions were provided.   Findings 900 ml of blood tinged pleural fluid was obtained. A sample was sent for GS, cx and chemistries.  Complications:  None; patient tolerated the procedure well.          Condition: stable  Plan A follow up chest x-ray reveals improved effusion but increased bilateral AS dz   I was present for and supervised the entire procedure  Billy Fischer, MD ; [facility] service [facility] [phone].  After 5:30 PM or weekends, call [phone]

## 2013-10-27 NOTE — Progress Notes (Signed)
ANTIBIOTIC CONSULT NOTE - INITIAL  Pharmacy Consult for vancomycin, levaquin Indication: pneumonia  No Known Allergies  Patient Measurements: Height:  (162.6 cm) Weight: 173 lb 1 oz (78.5 kg) IBW/kg (Calculated) : 54.7 Adjusted Body Weight:   Vital Signs: Temp: 98.1 F (36.7 C) (08/25 1300) Temp src: Oral (08/25 1300) BP: 116/92 mmHg (08/25 1500) Pulse Rate: 100 (08/25 1500) Intake/Output from previous day: 08/24 0701 - 08/25 0700 In: 684 [I.V.:684] Out: 1360 [Urine:1360] Intake/Output from this shift: Total I/O In: 154 [I.V.:154] Out: 205 [Urine:205]  Labs:  Recent Labs  10/25/13 0415 10/25/13 1358 10/26/13 0115 10/27/13 0344  WBC 13.2* 13.9* 13.8* 18.3*  HGB 8.3* 8.7* 8.5* 7.5*  PLT 243 266 307 452*  CREATININE 0.80  --  0.69 0.68   Estimated Creatinine Clearance: 69.2 ml/min (by C-G formula based on Cr of 0.68).  Recent Labs  10/26/13 0829  VANCOTROUGH 10.4     Medical History: Past Medical History  Diagnosis Date  . Pulmonary embolism 10/2013  . Hyperparathyroidism   . HTN (hypertension)   . Hyperlipidemia   . Depression     Medications:  Prescriptions prior to admission  Medication Sig Dispense Refill  . alendronate (FOSAMAX) 70 MG tablet Take 70 mg by mouth every Thursday. Take with a full glass of water on an empty stomach.      Marland Kitchen atorvastatin (LIPITOR) 40 MG tablet Take 20 mg by mouth daily at 6 PM.      . buPROPion (WELLBUTRIN XL) 150 MG 24 hr tablet Take 450 mg by mouth daily.      . cyclobenzaprine (FLEXERIL) 5 MG tablet Take 5 mg by mouth 3 (three) times daily.      . fluticasone (FLONASE) 50 MCG/ACT nasal spray Place 1 spray into both nostrils daily as needed for allergies or rhinitis.      Marland Kitchen ondansetron (ZOFRAN) 8 MG tablet Take 8 mg by mouth 2 (two) times daily as needed for nausea or vomiting.      Marland Kitchen oxyCODONE-acetaminophen (PERCOCET/ROXICET) 5-325 MG per tablet Take 1-2 tablets by mouth every 6 (six) hours as needed for severe  pain (pain).      Marland Kitchen oxymetazoline (AFRIN) 0.05 % nasal spray Place 1 spray into both nostrils 2 (two) times daily as needed for congestion.      Marland Kitchen venlafaxine XR (EFFEXOR-XR) 150 MG 24 hr capsule Take 300 mg by mouth daily.       Assessment: Pt with saddle PE, s/p tPA per EKOS protocol.  CXR with bilateral pulmonary infiltrates.  Also underwent thoracentesis which was sent for culture and sensitivities.  LA wnl, WBC trending up, afebrile, stable renal function.  Initiating higher vancomycin dose as pt was previously subtherapeutic on 1g/12h.   Goal of Therapy:  Vancomycin trough level 15-20 mcg/ml  Plan:  Vancomycin 1 g IV q8h Levaquin 750 mg IV q24h  VT as needed  Monitor blood cx, urine cx, pleural fluid cx, renal fx   Agapito Games, PharmD, BCPS Clinical Pharmacist Pager: [phone] 10/27/2013 3:25 PM

## 2013-10-27 NOTE — Progress Notes (Signed)
Heparin drip resumed at 1530 after speaking with Carly, the pharmacist.  Wenda Low instructed me to turn the heparin drip off after the first dose of Xarelto has been given.   At 1635, Cindee Lame- NP came by and patient's oxygen level was in the 80's. Patient had taken off her non-rebreather mask and her oxygen level dropped.  NP instructed me that the patient needed to be placed on bipap. Went in to assess the patient and her oxygen level was 99%, RR was 28, HR 102. The patient declined bipap at this time. Educated patient that she needed to keep her mask on and the importance.     [place]emphill, Sherrilee Gilles, RN

## 2013-10-27 NOTE — Progress Notes (Addendum)
ANTICOAGULATION CONSULT NOTE - Follow Up Consult  Pharmacy Consult for Heparin, transition to Xarelto Indication: pulmonary embolus  No Known Allergies  Patient Measurements: Height:  (162.6 cm) Weight: 173 lb 1 oz (78.5 kg) IBW/kg (Calculated) : 54.7  Vital Signs: Temp: 98.3 F (36.8 C) (08/25 0758) Temp src: Oral (08/25 0758) BP: 129/50 mmHg (08/25 0900) Pulse Rate: 102 (08/25 0900)  Labs:  Recent Labs  10/25/13 0415 10/25/13 1358 10/25/13 1949 10/26/13 0115 10/27/13 0344  HGB 8.3* 8.7*  --  8.5* 7.5*  HCT 24.4* 25.8*  --  24.8* 22.1*  PLT 243 266  --  307 452*  HEPARINUNFRC 0.28*  --  0.58 0.62 0.37  CREATININE 0.80  --   --  0.69 0.68    Assessment: 67 YOF on IV heparin for saddle PE s/p catheter-directed lysis x24hr. Heparin level down today but is still therapeutic at 0.37. Hgb 7.5, PLTC 452, no s/sx of bleeding.   Goal of Therapy:  Heparin level 0.3-0.7 units/ml Monitor platelets by anticoagulation protocol: Yes   Plan:  -Continue heparin at 1850 units/hr.  -Recheck heparin level @ 1300 to confirm no further drop in level -Daily CBC/HL -Monitor s/sx of bleeding  Thank you for allowing pharmacy to be part of this patient's care team  Infantof Villagomez M. Danyel Griess, Pharm.D Clinical Pharmacy Resident Pager: [phone] 10/27/2013 .9:38 AM ________________________________________ PM: Pharmacy consulted to transition patient to Xarelto.  1230 HL therapeutic at 0.49. Heparin currently turned off for thoracentesis. No s/sx of bleeding.  Plan: - Restart heparin gtt @ 1850 s/p thoracentesis - Begin Xarelto  BID with dinner tonight x 21 days - Discontinue heparin at the same time Xarelto is given - Transition to  daily with largest meal on 11/18/13 - Monitor renal function and s/sx of bleeding  Thank you for allowing pharmacy to be part of this patient's care team  Kieana Livesay M. Destiney Sanabia, Pharm.D Clinical Pharmacy Resident Pager: [phone] 10/27/2013 .1:52  PM

## 2013-10-28 LAB — CBC
HEMATOCRIT: 25.1 % — AB (ref 36.0–46.0)
Hemoglobin: 8.2 g/dL — ABNORMAL LOW (ref 12.0–15.0)
MCH: 28 pg (ref 26.0–34.0)
MCHC: 32.7 g/dL (ref 30.0–36.0)
MCV: 85.7 fL (ref 78.0–100.0)
Platelets: 418 10*3/uL — ABNORMAL HIGH (ref 150–400)
RBC: 2.93 MIL/uL — ABNORMAL LOW (ref 3.87–5.11)
RDW: 14.4 % (ref 11.5–15.5)
WBC: 15.9 10*3/uL — ABNORMAL HIGH (ref 4.0–10.5)

## 2013-10-28 LAB — PATHOLOGIST SMEAR REVIEW: PATH REVIEW: REACTIVE

## 2013-10-28 LAB — PROCALCITONIN: Procalcitonin: 0.57 ng/mL

## 2013-10-28 LAB — HEPARIN LEVEL (UNFRACTIONATED): HEPARIN UNFRACTIONATED: 1.84 [IU]/mL — AB (ref 0.30–0.70)

## 2013-10-28 NOTE — Progress Notes (Signed)
Called to pt's room to assist with putting pt onto BiPAP. Pt did not tolerate NIV, and asked to be placed back on NRB. Explained that I would be more than happy to try again later if she would like. She declined.

## 2013-10-28 NOTE — Progress Notes (Signed)
PULMONARY / CRITICAL CARE MEDICINE HISTORY AND PHYSICAL EXAMINATION   Name: Christy Short MRN: [id number] DOB: Jul 03, 1945    ADMISSION DATE:  10/23/2013  PRIMARY SERVICE: PCCM  CHIEF COMPLAINT:  SOB   BRIEF PATIENT DESCRIPTION: 64 F with submassive PE transferred from [facility] in setting of worsening hypoxemia.  SIGNIFICANT EVENTS / STUDIES:  8/18 CT ([facility]): PE present, other details not available   8/19 Lower Extremity Doppler (+) DVT  8/18 Echocardiogram:  EF of > 70%, a moderately dilated RV, and moderately reduced RV function.  8/21 CTA chest: Large volume embolus including small saddle PE, evidence of RH strain, RV/LV ratio 1.1 8/23 Echocardiogram: Left sdie normal. RV mildly dilated, RV syst function mildly reduced 8/25 Moderate to large L pleural effusion on CXR. Still requiring NRB mask 8/25 L thoracentesis: LDH 367, protein 2.7, WBC 1996, 72% neutrophils 8/25 changed from heparin to rivaroxaban 8/25 increasing bilateral pulm infiltrates - empiric steroids and abx ordered. Procalcitonin protocol initiated 8/26 Slightly less dyspneic but still requires NRB and intermittent NPPV. PCT 0.66, 0.57  LINES / TUBES: R groin sheath 8/22 >> 8/23  CULTURES: Urine 8/21 >> NEG Blood 8/21 >> NEG Sputum 8/25 >>  Blood 8/25 >>  Pleural fluid 8/25 >>   ANTIBIOTICS: Vanc 8/21 >> 8/24 Cefepmine 8/21>> 8/24 Azithro 8/21 >> 8/24 Vanc 8/25 >>  Levofloxacin 8/25 >>    SUBJECTIVE:   Slightly less tachypneic. No new complaints  VITAL SIGNS: Temp:  [97.4 F (36.3 C)-98.4 F (36.9 C)] 97.4 F (36.3 C) (08/26 1256) Pulse Rate:  [74-100] 86 (08/26 1200) Resp:  [21-41] 29 (08/26 1200) BP: (94-142)/(40-92) 126/61 mmHg (08/26 1200) SpO2:  [90 %-100 %] 95 % (08/26 1200) FiO2 (%):  [50 %-80 %] 60 % (08/25 2000) HEMODYNAMICS:   VENTILATOR SETTINGS: Vent Mode:  [-] BIPAP FiO2 (%):  [50 %-80 %] 60 % Set Rate:  [18 bmp] 18 bmp INTAKE / OUTPUT: Intake/Output     08/25 0701  - 08/26 0700 08/26 0701 - 08/27 0700   I.V. (mL/kg) 324.5 (4.1)    IV Piggyback 550 200   Total Intake(mL/kg) 874.5 (11.1) 200 (2.5)   Urine (mL/kg/hr) 1370 (0.7) 515 (0.9)   Other 900 (0.5)    Total Output 2270 515   Net -1395.5 -315          PHYSICAL EXAMINATION: General: NAD Neuro: no focal deficits HEENT: WNL Neck: no JVD noted Cardiovascular:reg, no M Lungs: clear anteriorly, crackles L>R base Abdomen:  Soft, +BS Ext:  RLE cast, no edema noted  LABS: I have reviewed all of today's lab results. Relevant abnormalities are discussed in the A/P section  CXR: NNF   ASSESSMENT / PLAN:  PULMONARY A: Acute Hypoxic Resp Failure Submassive PE - s/p catheter directed lysis 8/22 Bilateral pulm infiltrates unclear etiology L pleural effusion P:   Rivaroxaban initiated 8/25 Wean O2 as tolerated for SpO2 > 90 % Cont BiPAP as needed Cont empiric steroids initiated 8/25 Cont empiric abx initiated 8/25 F/U CXR AM 8/27  CARDIOVASCULAR A: HTN, controlled Hyperlipidemia P:   Holding antihypertensives Continue statin  RENAL Hyponatremia, mild Hypokalemia, recurrent P:   Monitor BMET intermittently Monitor I/Os Correct electrolytes as indicated  GASTROINTESTINAL A: No acute issues Cont reg diet  HEMATOLOGIC A: Anemia without acute bleeding P:  Cont rivaroxaban Monitor CBC intermittently Transfuse per usual ICU guidelines  INFECTIOUS A: Extensive pulmonary infiltrates - possible PNA P:   Monitor temp, WBC count Micro and abx as above  ENDOCRINE  A: Hx Hyperparathyroidism  P:   Monitor Ca  NEUROLOGIC A: Depression P:   Cont Home Meds   TODAY'S SUMMARY:   Remains tenuous. Cont to monitor in ICU for now   Billy Fischer, MD ; [facility] [phone].  After 5:30 PM or weekends, call [phone]

## 2013-10-28 NOTE — Care Management Note (Addendum)
    Page 1 of 2   11/02/2013     3:44:56 PM CARE MANAGEMENT NOTE 11/02/2013  Patient:  Christy Short, Christy Short   Account Number:  [id number]  Date Initiated:  10/28/2013  Documentation initiated by:  Junius Creamer  Subjective/Objective Assessment:   adm w pul embolus     Action/Plan:   lives w fam   Anticipated DC Date:  11/02/2013   Anticipated DC Plan:  HOME W HOME HEALTH SERVICES      DC Planning Services  CM consult  Medication Assistance      [facility] Choice  HOME HEALTH   Choice offered to / List presented to:  C-1 Patient        HH arranged  HH-2 PT      HH agency  [facility]   Status of service:  Completed, signed off Medicare Important Message given?  YES (If response is "NO", the following Medicare IM given date fields will be blank) Date Medicare IM given:  10/28/2013 Medicare IM given by:  Junius Creamer Date Additional Medicare IM given:  11/02/2013 Additional Medicare IM given by:  Letha Cape  Discharge Disposition:  HOME [facility] SERVICES  Per UR Regulation:  Reviewed for med. necessity/level of care/duration of stay  If discussed at Long Length of Stay Meetings, dates discussed:   10/29/2013    Comments:  11/02/13 1543 Letha Cape RN, BSN [phone] patient for dc today, would like to work with [facility] for HHPT, referral made to [facility], Debbie notified.  Soc will begin 24-48 hrs post dc.  8/26 1216p debbie dowell rn,bsn spoke w pt and fam. pt has medicare and supplemnt. gave her 30day free xarelto card. pt states does have supplement for meds. has 42.00 per month copay w no prior auth req.

## 2013-10-29 ENCOUNTER — Inpatient Hospital Stay (HOSPITAL_COMMUNITY): Payer: Medicare Other

## 2013-10-29 LAB — CBC
HCT: 25.6 % — ABNORMAL LOW (ref 36.0–46.0)
HEMOGLOBIN: 8.4 g/dL — AB (ref 12.0–15.0)
MCH: 28.5 pg (ref 26.0–34.0)
MCHC: 32.8 g/dL (ref 30.0–36.0)
MCV: 86.8 fL (ref 78.0–100.0)
Platelets: 492 10*3/uL — ABNORMAL HIGH (ref 150–400)
RBC: 2.95 MIL/uL — ABNORMAL LOW (ref 3.87–5.11)
RDW: 14.6 % (ref 11.5–15.5)
WBC: 17.5 10*3/uL — AB (ref 4.0–10.5)

## 2013-10-29 LAB — BASIC METABOLIC PANEL
Anion gap: 11 (ref 5–15)
BUN: 17 mg/dL (ref 6–23)
CO2: 30 mEq/L (ref 19–32)
Calcium: 9.1 mg/dL (ref 8.4–10.5)
Chloride: 95 mEq/L — ABNORMAL LOW (ref 96–112)
Creatinine, Ser: 0.7 mg/dL (ref 0.50–1.10)
GFR calc Af Amer: >90 mL/min (ref >90)
GFR calc non Af Amer: 88 mL/min — ABNORMAL LOW (ref >90)
GLUCOSE: 151 mg/dL — AB (ref 70–99)
POTASSIUM: 3.4 meq/L — AB (ref 3.7–5.3)
SODIUM: 136 meq/L — AB (ref 137–147)

## 2013-10-29 LAB — PROCALCITONIN: Procalcitonin: 0.36 ng/mL

## 2013-10-29 MED ORDER — HYDROCOD POLST-CHLORPHEN POLST 10-8 MG/5ML PO LQCR: 5.0000 mL | Freq: Every evening | ORAL | Status: DC | PRN

## 2013-10-29 MED ORDER — POTASSIUM CHLORIDE CRYS ER 20 MEQ PO TBCR
20.0000 meq | EXTENDED_RELEASE_TABLET | ORAL | Status: AC
  Administered 2013-10-29 (×2): 20 meq via ORAL
  Filled 2013-10-29 (×2): qty 1

## 2013-10-29 MED ORDER — HYDROCOD POLST-CHLORPHEN POLST 10-8 MG/5ML PO LQCR
5.0000 mL | Freq: Two times a day (BID) | ORAL | Status: DC | PRN
  Administered 2013-10-29 (×2): 5 mL via ORAL
  Filled 2013-10-29 (×2): qty 5

## 2013-10-29 MED ORDER — MENTHOL 3 MG MT LOZG
1.0000 | LOZENGE | OROMUCOSAL | Status: AC | PRN
  Administered 2013-10-29: 3 mg via ORAL
  Filled 2013-10-29 (×2): qty 9

## 2013-10-29 NOTE — Progress Notes (Signed)
Pt stated that she wanted to try wearing the BiPAP again. Connected pt, and pt seems to be tolerating it well. Vitals stable.

## 2013-10-29 NOTE — Discharge Instructions (Signed)
Information on my medicine - XARELTO (rivaroxaban)  This medication education was reviewed with me or my healthcare representative as part of my discharge preparation.  The pharmacist that spoke with me during my hospital stay was:  Taressa Rauh M, RPH  WHY WAS XARELTO PRESCRIBED FOR YOU? Xarelto was prescribed to treat blood clots that may have been found in the veins of your legs (deep vein thrombosis) or in your lungs (pulmonary embolism) and to reduce the risk of them occurring again.  What do you need to know about Xarelto? The starting dose is one 15 mg tablet taken TWICE daily with food for the FIRST 21 DAYS then on (enter date)  11/19/13  the dose is changed to one 20 mg tablet taken ONCE A DAY with your evening meal.  DO NOT stop taking Xarelto without talking to the health care provider who prescribed the medication.  Refill your prescription for 20 mg tablets before you run out.  After discharge, you should have regular check-up appointments with your healthcare provider that is prescribing your Xarelto.  In the future your dose may need to be changed if your kidney function changes by a significant amount.  What do you do if you miss a dose? If you are taking Xarelto TWICE DAILY and you miss a dose, take it as soon as you remember. You may take two 15 mg tablets (total 30 mg) at the same time then resume your regularly scheduled 15 mg twice daily the next day.  If you are taking Xarelto ONCE DAILY and you miss a dose, take it as soon as you remember on the same day then continue your regularly scheduled once daily regimen the next day. Do not take two doses of Xarelto at the same time.   Important Safety Information Xarelto is a blood thinner medicine that can cause bleeding. You should call your healthcare provider right away if you experience any of the following:   Bleeding from an injury or your nose that does not stop.   Unusual colored urine (red or dark brown) or  unusual colored stools (red or black).   Unusual bruising for unknown reasons.   A serious fall or if you hit your head (even if there is no bleeding).  Some medicines may interact with Xarelto and might increase your risk of bleeding while on Xarelto. To help avoid this, consult your healthcare provider or pharmacist prior to using any new prescription or non-prescription medications, including herbals, vitamins, non-steroidal anti-inflammatory drugs (NSAIDs) and supplements.  This website has more information on Xarelto: VisitDestination.com.br.

## 2013-10-29 NOTE — Progress Notes (Signed)
ANTICOAGULATION/ANTIBIOTIC CONSULT NOTE - Follow Up Consult  Pharmacy Consult for Xarelto + Levaquin Indication: pulmonary embolus + pneumonia  No Known Allergies  Patient Measurements: Height:  (162.6 cm) Weight: 173 lb 1 oz (78.5 kg) IBW/kg (Calculated) : 54.7  Vital Signs: Temp: 97.6 F (36.4 C) (08/27 0747) Temp src: Oral (08/27 0747) BP: 127/59 mmHg (08/27 1100) Pulse Rate: 83 (08/27 1100)  Labs:  Recent Labs  10/27/13 0344 10/27/13 1230 10/28/13 0220 10/29/13 0316  HGB 7.5*  --  8.2* 8.4*  HCT 22.1*  --  25.1* 25.6*  PLT 452*  --  418* 492*  HEPARINUNFRC 0.37 0.49 1.84*  --   CREATININE 0.68  --   --  0.70    Assessment: 67 YOF on IV heparin for saddle PE s/p catheter-directed lysis x24hr. Transitioned from heparin to Xarelto on 8/25. Hgb 8.4 , PLTC 492, no s/sx of bleeding.    CXR 8/25 suggestive of pneumonia, pharmacy consulted to start Vanc and Levaquin. Vanc dc'd, continuing treatment with monotherapy levaquin.  WBC 15>>17.5, afebrile. Antibiotic day #7.   Vanc 8/21 >> 8/24 Vanc 8/25>>8/27 Levaquin 8/25>>  Goal of Therapy:  Complete anticoagulation Eradication of infection   Plan:  - Xarelto  BID with dinner x 21 days - Transition to  daily with largest meal on 11/18/13 - Monitor renal function and s/sx of bleeding - Continue Levaquin 750 IV q24h - F/u abx LOT  Thank you for allowing pharmacy to be part of this patient's care team  Samil Mecham M. Areon Cocuzza, Pharm.D Clinical Pharmacy Resident Pager: [phone] 10/29/2013 .12:34 PM

## 2013-10-29 NOTE — Progress Notes (Signed)
[facility] ADULT [facility]AC[facility]  The patient does apply for the [facility] Adult ICU Electrolyte Replacment Protocol based on the criteria listed below:   1. Is GFR >/= 40 ml/min? Yes.    Patient's GFR today is 88 2. Is urine output >/= 0.5 ml/kg/hr for the last 6 hours? Yes.   Patient's UOP is 0.7 ml/kg/hr 3. Is BUN < 60 mg/dL? Yes.    Patient's BUN today is 17 4. Abnormal electrolyte(s): Potassium 5. Ordered repletion with: Potassium per Protocol  Christy Short P 10/29/2013 4:38 AM

## 2013-10-29 NOTE — Progress Notes (Signed)
PULMONARY / CRITICAL CARE MEDICINE HISTORY AND PHYSICAL EXAMINATION   Name: Christy Short MRN: [id number] DOB: May 06, 1945    ADMISSION DATE:  10/23/2013  PRIMARY SERVICE: PCCM  CHIEF COMPLAINT:  SOB   BRIEF PATIENT DESCRIPTION: 55 F with submassive PE transferred from [facility] in setting of worsening hypoxemia.  SIGNIFICANT EVENTS / STUDIES:  8/18 CT ([facility]): PE present, other details not available   8/19 Lower Extremity Doppler (+) DVT  8/18 Echocardiogram:  EF of > 70%, a moderately dilated RV, and moderately reduced RV function.  8/21 CTA chest: Large volume embolus including small saddle PE, evidence of RH strain, RV/LV ratio 1.1 8/23 Echocardiogram: Left sdie normal. RV mildly dilated, RV syst function mildly reduced 8/25 Moderate to large L pleural effusion on CXR. Still requiring NRB mask 8/25 L thoracentesis: LDH 367, protein 2.7, WBC 1996, 72% neutrophils 8/25 changed from heparin to rivaroxaban 8/25 increasing bilateral pulm infiltrates - empiric steroids and abx ordered. Procalcitonin protocol initiated 8/26 Slightly less dyspneic but still requires NRB and intermittent NPPV. PCT 0.66, 0.57 8/27 Less dyspneic. Able to wean from NRB to VM. CXR shows improvement in infiltrates  LINES / TUBES: R groin sheath 8/22 >> 8/23  CULTURES: Urine 8/21 >> NEG Blood 8/21 >> NEG Sputum 8/25 >> no specimen available Blood 8/25 >>  Pleural fluid 8/25 >>   ANTIBIOTICS: Vanc 8/21 >> 8/24 Cefepmine 8/21>> 8/24 Azithro 8/21 >> 8/24 Vanc 8/25 >> 8/27 Levofloxacin 8/25 >>    SUBJECTIVE:   Less dyspneic. Able to wean from NRB to VM. C/O NP cough  VITAL SIGNS: Temp:  [97.5 F (36.4 C)-98.3 F (36.8 C)] 97.6 F (36.4 C) (08/27 0747) Pulse Rate:  [75-92] 83 (08/27 1100) Resp:  [17-39] 29 (08/27 1100) BP: (100-137)/(47-74) 127/59 mmHg (08/27 1100) SpO2:  [94 %-100 %] 94 % (08/27 1100) FiO2 (%):  [50 %-60 %] 50 % (08/27 0347) HEMODYNAMICS:   VENTILATOR  SETTINGS: Vent Mode:  [-] BIPAP FiO2 (%):  [50 %-60 %] 50 % Set Rate:  [12 bmp] 12 bmp PEEP:  [5 cmH20] 5 cmH20 INTAKE / OUTPUT: Intake/Output     08/26 0701 - 08/27 0700 08/27 0701 - 08/28 0700   P.O. 280    I.V. (mL/kg) 50 (0.6) 40 (0.5)   Other 250    IV Piggyback 750    Total Intake(mL/kg) 1330 (16.9) 40 (0.5)   Urine (mL/kg/hr) 1215 (0.6) 120 (0.2)   Other     Total Output 1215 120   Net +115 -80          PHYSICAL EXAMINATION: General: NAD Neuro: no focal deficits HEENT: WNL Neck: no JVD noted Cardiovascular:reg, no M Lungs: clear anteriorly, crackles L>R base Abdomen:  Soft, +BS Ext:  RLE cast, no edema noted  LABS: I have reviewed all of today's lab results. Relevant abnormalities are discussed in the A/P section  CXR: Improving infiltrates. Bibasilar atx   ASSESSMENT / PLAN:  PULMONARY A: Acute Hypoxic Resp Failure Submassive PE - s/p catheter directed lysis 8/22 Bilateral pulm infiltrates unclear etiology Exudative L pleural effusion - s/p thoracentesis 8/25 P:   Rivaroxaban initiated 8/25 Wean O2 as tolerated for SpO2 > 90 % Cont BiPAP as needed Cont empiric steroids initiated 8/25 Cont empiric abx initiated 8/25  CARDIOVASCULAR A: HTN, controlled Hyperlipidemia P:   Holding antihypertensives Continue statin  RENAL Hyponatremia, mild Hypokalemia, recurrent P:   Monitor BMET intermittently Monitor I/Os Correct electrolytes as indicated  GASTROINTESTINAL A: No acute issues Cont reg  diet  HEMATOLOGIC A: Anemia without acute bleeding P:  Cont rivaroxaban Monitor CBC intermittently Transfuse per usual ICU guidelines  INFECTIOUS A: Extensive pulmonary infiltrates Low PCT P:   Monitor temp, WBC count Micro and abx as above Likely DC abx 8/28  ENDOCRINE A: Hx Hyperparathyroidism  P:   Monitor Ca  NEUROLOGIC A: Depression P:   Cont Home Meds   TODAY'S SUMMARY:   Remains tenuous. Cont to monitor in ICU for now. If  continues to improve, likely out of ICU 8/28   Billy Fischer, MD ; [facility] service Mobile [phone].  After 5:30 PM or weekends, call [phone]

## 2013-10-30 ENCOUNTER — Inpatient Hospital Stay (HOSPITAL_COMMUNITY): Payer: Medicare Other

## 2013-10-30 LAB — CBC
HEMATOCRIT: 27.1 % — AB (ref 36.0–46.0)
Hemoglobin: 8.7 g/dL — ABNORMAL LOW (ref 12.0–15.0)
MCH: 28.2 pg (ref 26.0–34.0)
MCHC: 32.1 g/dL (ref 30.0–36.0)
MCV: 87.7 fL (ref 78.0–100.0)
Platelets: 542 10*3/uL — ABNORMAL HIGH (ref 150–400)
RBC: 3.09 MIL/uL — AB (ref 3.87–5.11)
RDW: 15 % (ref 11.5–15.5)
WBC: 18.5 10*3/uL — ABNORMAL HIGH (ref 4.0–10.5)

## 2013-10-30 LAB — BASIC METABOLIC PANEL
Anion gap: 9 (ref 5–15)
BUN: 20 mg/dL (ref 6–23)
CO2: 31 meq/L (ref 19–32)
Calcium: 8.8 mg/dL (ref 8.4–10.5)
Chloride: 100 mEq/L (ref 96–112)
Creatinine, Ser: 0.79 mg/dL (ref 0.50–1.10)
GFR calc Af Amer: >90 mL/min (ref >90)
GFR calc non Af Amer: 84 mL/min — ABNORMAL LOW (ref >90)
GLUCOSE: 104 mg/dL — AB (ref 70–99)
POTASSIUM: 4 meq/L (ref 3.7–5.3)
SODIUM: 140 meq/L (ref 137–147)

## 2013-10-30 LAB — CULTURE, BLOOD (ROUTINE X 2)
Culture: NO GROWTH
Culture: NO GROWTH

## 2013-10-30 LAB — HIV ANTIBODY (ROUTINE TESTING W REFLEX): HIV 1&2 Ab, 4th Generation: NONREACTIVE

## 2013-10-30 MED ORDER — PROMETHAZINE-CODEINE 6.25-10 MG/5ML PO SYRP
5.0000 mL | ORAL_SOLUTION | Freq: Four times a day (QID) | ORAL | Status: DC | PRN
  Administered 2013-10-30 – 2013-11-01 (×3): 5 mL via ORAL
  Filled 2013-10-30 (×3): qty 5

## 2013-10-30 MED ORDER — METHYLPREDNISOLONE SODIUM SUCC 40 MG IJ SOLR
40.0000 mg | Freq: Two times a day (BID) | INTRAMUSCULAR | Status: DC
  Administered 2013-10-30 – 2013-11-01 (×4): 40 mg via INTRAVENOUS
  Filled 2013-10-30 (×8): qty 1

## 2013-10-30 NOTE — Progress Notes (Addendum)
PULMONARY / CRITICAL CARE MEDICINE HISTORY AND PHYSICAL EXAMINATION   Name: Christy Short MRN: [id number] DOB: 08/02/1945    ADMISSION DATE:  10/23/2013  PRIMARY SERVICE: PCCM  CHIEF COMPLAINT:  SOB   BRIEF PATIENT DESCRIPTION: 46 F transferred from [facility] with recent dx of PE after R ankle fx and worsening hypoxemia.  SIGNIFICANT EVENTS / STUDIES:                             PRIOR TO ADMISSION 8/18 CT ([facility]): PE present, other details not available   8/19 Lower Extremity Doppler (+) DVT  8/18 Echocardiogram:  EF of > 70%, a moderately dilated RV, and moderately reduced RV function.  ------------------------------------------------------------------------------------------------------------------------------------                            THIS ADMISSION 8/21 Admitted in transfer from [facility] with recent dx of PE after R ankle fx and worsening hypoxemia. 8/21 CTA chest: Large volume embolus including small saddle PE, evidence of RH strain, RV/LV ratio 1.1 8/23 Echocardiogram: Left sdie normal. RV mildly dilated, RV syst function mildly reduced 8/25 Moderate to large L pleural effusion on CXR. Still requiring NRB mask 8/25 L thoracentesis: LDH 367, protein 2.7, WBC 1996, 72% neutrophils 8/25 changed from heparin to rivaroxaban 8/25 increasing bilateral pulm infiltrates - empiric steroids and abx ordered. Procalcitonin protocol initiated 8/26 Slightly less dyspneic but still requires NRB and intermittent NPPV. PCT 0.66, 0.57 8/27 Less dyspneic. Able to wean from NRB to VM. CXR shows improvement in infiltrates 8/28 Markedly improved CXR. Able to maintain sats on [place] O2. Transfer to med-surg bed ordered  LINES / TUBES: R groin sheath 8/22 >> 8/23  CULTURES: Urine 8/21 >> NEG Blood 8/21 >> NEG Sputum 8/25 >> no specimen available Blood 8/25 >> NEG Pleural fluid 8/25 >> NEG  ANTIBIOTICS: Vanc 8/21 >> 8/24 Cefepmine 8/21>> 8/24 Azithro 8/21 >> 8/24 Vanc 8/25 >> 8/27 Levofloxacin  8/25 >> 8/28   SUBJECTIVE:   Continues to improve. Still with NP cough. No distress. No new complaints  VITAL SIGNS: Temp:  [97.9 F (36.6 C)-98.9 F (37.2 C)] 98 F (36.7 C) (08/28 0811) Pulse Rate:  [74-100] 77 (08/28 0700) Resp:  [18-44] 22 (08/28 0700) BP: (95-167)/(48-137) 135/110 mmHg (08/28 0700) SpO2:  [88 %-100 %] 100 % (08/28 0700) FiO2 (%):  [40 %-55 %] 55 % (08/28 0800) HEMODYNAMICS:   VENTILATOR SETTINGS: Vent Mode:  [-]  FiO2 (%):  [40 %-55 %] 55 % INTAKE / OUTPUT: Intake/Output     08/27 0701 - 08/28 0700 08/28 0701 - 08/29 0700   P.O. 50    I.V. (mL/kg) 230 (2.9) 10 (0.1)   Other     IV Piggyback 150    Total Intake(mL/kg) 430 (5.5) 10 (0.1)   Urine (mL/kg/hr) 845 (0.4)    Total Output 845     Net -415 +10        Urine Occurrence 1 x      PHYSICAL EXAMINATION: General: NAD, intermittent cough Neuro: no focal deficits HEENT: WNL Neck: no JVD noted Cardiovascular: reg, no M Lungs: faint bibasilar crackles Abdomen:  Soft, +BS Ext:  RLE cast, no edema noted  LABS: I have reviewed all of today's lab results. Relevant abnormalities are discussed in the A/P section  CXR: Cont improvement in B infiltrates   ASSESSMENT / PLAN:  PULMONARY A: Acute Hypoxic Resp Failure Submassive  PE - s/p catheter directed lysis 8/22 Bilateral pulm infiltrates/pneumonitis - appear to be steroid responsive Exudative L pleural effusion - s/p thoracentesis 8/25 Moderately severe nonproductive cough P:   Cont rivaroxaban initiated 8/25 Cont to wean O2 as tolerated for SpO2 > 90 % Cont empiric steroids initiated 8/25 - dose reduced 8/28 DC empiric abx 8/28 Codeine - promethazine syrup PRN ordered 8/28  CARDIOVASCULAR A: H/O hypertension - no anti-hypertensive meds listed on med rec Hyperlipidemia P:   Continue statin  RENAL Hyponatremia, resolved Hypokalemia, resolved P:   Monitor BMET intermittently Monitor I/Os Correct electrolytes as  indicated  GASTROINTESTINAL A: No acute issues P:  Cont reg diet  HEMATOLOGIC A: Anemia without acute bleeding P:  Cont rivaroxaban Monitor CBC intermittently Transfuse per usual ICU guidelines  INFECTIOUS A: Extensive pulmonary infiltrates - doubt infectious (Low PCT) P:   Monitor temp, WBC count Micro and abx as above  ENDOCRINE A: Hx Hyperparathyroidism  P:   Monitor Ca  NEUROLOGIC A: Depression Deconditioning P:   Cont Home Meds PT eval 8/28  TODAY'S SUMMARY:   Transfer to [facility] 8/28.  To remain on PCCM service Taper steroids as indicated by clinical course Might require short term home O2 after discharge Has F/U appointment with surgeon in [place] 9/01. If not discharged first of next week, this will need to be rescheduled  Billy Fischer, MD ; [facility] [phone].  After 5:30 PM or weekends, call [phone]

## 2013-10-31 LAB — BODY FLUID CULTURE
Culture: NO GROWTH
Special Requests: NORMAL

## 2013-10-31 MED ORDER — ENSURE COMPLETE PO LIQD
237.0000 mL | Freq: Three times a day (TID) | ORAL | Status: DC
  Administered 2013-10-31 – 2013-11-02 (×5): 237 mL via ORAL

## 2013-10-31 NOTE — Evaluation (Signed)
Physical Therapy Evaluation Patient Details Name: Christy Short MRN: [id number] DOB: Jan 11, 1946 Today's Date: 10/31/2013   History of Present Illness  89 F with submassive PE in setting of worsening hypoxemia.  Has RLE DVT, L thoracentesis and EF >70%.  Clinical Impression  Pt is more debilitated than her report of home with much more difficulty managing transfers and bed mobility.  Her husband and she have managed up to this point well but with new pulmonary changes have encouraged her to think about SNF care.  Neither was willing to commit to the idea, and for now have still planned for her to go home when she is medically stable.    Follow Up Recommendations Home health PT;SNF;Supervision/Assistance - 24 hour    Equipment Recommendations  None recommended by PT    Recommendations for Other Services OT consult     Precautions / Restrictions Precautions Precautions: Fall;Other (comment) (RLE NWB) Restrictions Weight Bearing Restrictions: Yes RLE Weight Bearing: Non weight bearing      Mobility  Bed Mobility Overal bed mobility: Needs Assistance Bed Mobility: Supine to Sit     Supine to sit: Min assist     General bed mobility comments: slow process due to fatigue from O2 sats and difficulty using RLE  Transfers Overall transfer level: Needs assistance Equipment used: Rolling walker (2 wheeled);None Transfers: Sit to/from [employer] Sit to Stand: Mod assist Stand pivot transfers: Mod assist       General transfer comment: Could not fully stand at walker and used gait belt to pivot to chair due to limited energy from pulmonary changes  Ambulation/Gait                Stairs            Wheelchair Mobility    Modified Rankin (Stroke Patients Only)       Balance Overall balance assessment: Needs assistance Sitting-balance support: Bilateral upper extremity supported (LLE) Sitting balance-Leahy Scale: Fair   Postural control:  Posterior lean Standing balance support: Bilateral upper extremity supported Standing balance-Leahy Scale: Poor Standing balance comment: Requires assistance hands on due to limited energy and strength to stand just on LLE, in contrast to being able prior to pulm changes.                             Pertinent Vitals/Pain Pain Assessment: No/denies pain Pulse 84-94 during therapy and O2 sat 96% at rest, dropped briefly during therapy on first attempt sitting to 79% quickly and recovered almost immediately to 96%.  Was 96% after pivoting to chair.    Home Living Family/patient expects to be discharged to:: Private residence Living Arrangements: Spouse/significant other Available Help at Discharge: Family;Available 24 hours/day Type of Home: House Home Access: Ramped entrance     Home Layout: One level Home Equipment: Walker - 2 wheels;Tub bench;Bedside commode;Wheelchair - manual      Prior Function Level of Independence: Independent               Hand Dominance        Extremity/Trunk Assessment   Upper Extremity Assessment: Overall WFL for tasks assessed           Lower Extremity Assessment: Generalized weakness      Cervical / Trunk Assessment: Normal  Communication   Communication: No difficulties  Cognition Arousal/Alertness: Awake/alert Behavior During Therapy: WFL for tasks assessed/performed Overall Cognitive Status: Within Functional Limits for tasks assessed  General Comments General comments (skin integrity, edema, etc.): Pt is with husband who has been assisting her but now requires more help.  Spoke with pt about SNF care if possible and both were non committal at this point    Exercises        Assessment/Plan    PT Assessment Patient needs continued PT services  PT Diagnosis Difficulty walking   PT Problem List Decreased strength;Decreased range of motion;Decreased activity tolerance;Decreased  balance;Decreased mobility;Cardiopulmonary status limiting activity  PT Treatment Interventions DME instruction;Gait training;Functional mobility training;Therapeutic activities;Therapeutic exercise;Balance training;Neuromuscular re-education;Patient/family education   PT Goals (Current goals can be found in the Care Plan section) Acute Rehab PT Goals Patient Stated Goal: to go home PT Goal Formulation: With patient/family Time For Goal Achievement: 11/06/13 Potential to Achieve Goals: Good    Frequency Min 3X/week   Barriers to discharge Inaccessible home environment Ramped entrance and has mod assist to transfer to chair with pt being unable to use RW for standing yet    Co-evaluation               End of Session Equipment Utilized During Treatment: Gait belt;Oxygen Activity Tolerance: Patient tolerated treatment well;Patient limited by fatigue Patient left: in chair;with call bell/phone within reach;with family/visitor present Nurse Communication: Mobility status;Weight bearing status         Time: 1037-1100 PT Time Calculation (min): 23 min   Charges:   PT Evaluation $Initial PT Evaluation Tier I: 1 Procedure PT Treatments $Therapeutic Activity: 8-22 mins  Samul Dada, PT MS Acute Rehab Dept. Number: [phone]   PT G Codes:          Ivar Drape 2013/11/26, 11:27 AM

## 2013-10-31 NOTE — Plan of Care (Signed)
Problem: Acute Rehab PT Goals(only PT should resolve) Goal: Patient Will Transfer Sit To/From Stand With LRAD Goal: Pt Will Ambulate And NWB on RLE

## 2013-10-31 NOTE — Progress Notes (Signed)
INITIAL NUTRITION ASSESSMENT  DOCUMENTATION CODES Per approved criteria  -Not Applicable   INTERVENTION: - Ensure Complete TID - Encouraged increased meal intake - RD to continue to monitor   NUTRITION DIAGNOSIS: Inadequate oral intake related to poor appetite as evidenced by 25% meal intake.   Goal: Pt to consume >90% of meals/supplements  Monitor:  Weights, labs, intake  Reason for Assessment: Malnutrition screening tool   68 y.o. female  Admitting Dx: PE (pulmonary embolism)  ASSESSMENT: Pt with HTN, HL, and a recent PE discovered following a right complex ankle fracture who was [facility] hypoxemia. She was first admitted to [facility] on 8/18 after she presented to her PMD for "not feeling right" with CP and SOB and was found to have the PE and a RLE DVT. She had continued worsening of her hypoxemia and developed a mild fever. She was transferred to [facility] for possible catheter-directed thrombolysis which was completed 8/22.  - Pt and sister in room - Both report pt with minimal intake x 9 days, just 4-5 bites of food at mealtimes - Pt reports weight down 5 pounds recently, unsure of time frame, however sister suspects some of that was fluid weight - Pt said when she was feeling better she would eat 3 meals/day - PO intake today was 25% of lunch   Height: Ht Readings from Last 1 Encounters:  10/31/13  (1.626 m)    Weight: Wt Readings from Last 1 Encounters:  10/31/13 162 lb 0.6 oz (73.5 kg)    Ideal Body Weight: 120 lbs   % Ideal Body Weight: 135%  Wt Readings from Last 10 Encounters:  10/31/13 162 lb 0.6 oz (73.5 kg)    Usual Body Weight: 167 lbs  % Usual Body Weight: 97%  BMI:  Body mass index is 27.8 kg/(m^2). Overweight  Estimated Nutritional Needs: Kcal: 1450-1650 Protein: 65-80g Fluid: per MD  Skin: +1 generalized edema, +1 RLE, LLE edema  Diet Order: General  EDUCATION NEEDS: -No education  needs identified at this time   Intake/Output Summary (Last 24 hours) at 10/31/13 1636 Last data filed at 10/31/13 1332  Gross per 24 hour  Intake    120 ml  Output    550 ml  Net   -430 ml    Last BM: 8/25  Labs:   Recent Labs Lab 10/26/13 0115 10/27/13 0344 10/29/13 0316 10/30/13 0329  NA 133* 134* 136* 140  K 3.2* 4.1 3.4* 4.0  CL 95* 96 95* 100  CO2 BUN CREATININE 0.69 0.68 0.70 0.79  CALCIUM 8.5 9.2 9.1 8.8  MG 2.2  --   --   --   PHOS 1.6*  --   --   --   GLUCOSE 97 80 151* 104*    CBG (last 3)  No results found for this basename: GLUCAP,  in the last 72 hours  Scheduled Meds: . antiseptic oral rinse  7 mL Mouth Rinse q12n4p  . atorvastatin  20 mg Oral q1800  . buPROPion  450 mg Oral Daily  . cyclobenzaprine  5 mg Oral TID  . methylPREDNISolone (SOLU-MEDROL) injection  40 mg Intravenous Q12H  . Rivaroxaban  15 mg Oral BID WC  . [START ON 11/18/2013] rivaroxaban  20 mg Oral Q supper  . venlafaxine XR  300 mg Oral Daily    Continuous Infusions:   Past Medical History  Diagnosis Date  .  Pulmonary embolism 10/2013  . Hyperparathyroidism   . HTN (hypertension)   . Hyperlipidemia   . Depression     Past Surgical History  Procedure Laterality Date  . Parathyroidectomy      x2  . Orif ankle fracture      Charlott Rakes MS, RD, LDN [phone] Weekend/After Hours Pager

## 2013-10-31 NOTE — Progress Notes (Signed)
PULMONARY / CRITICAL CARE MEDICINE HISTORY AND PHYSICAL EXAMINATION   Name: Christy Short MRN: [id number] DOB: [birth year]    ADMISSION DATE:  10/23/2013  PRIMARY SERVICE: PCCM  CHIEF COMPLAINT:  SOB   BRIEF PATIENT DESCRIPTION: 48 F transferred from [facility] with recent dx of PE after R ankle fx and worsening hypoxemia.  SIGNIFICANT EVENTS / STUDIES:                             PRIOR TO ADMISSION 8/18 CT ([facility]): PE present, other details not available   8/19 Lower Extremity Doppler (+) DVT  8/18 Echocardiogram:  EF of > 70%, a moderately dilated RV, and moderately reduced RV function.  ------------------------------------------------------------------------------------------------------------------------------------                            THIS ADMISSION 8/21 Admitted in transfer from [facility] with recent dx of PE after R ankle fx and worsening hypoxemia. 8/21 CTA chest: Large volume embolus including small saddle PE, evidence of RH strain, RV/LV ratio 1.1 8/22 catheter directed TPA lysis of PE 8/23 Echocardiogram: Left sdie normal. RV mildly dilated, RV syst function mildly reduced 8/25 Moderate to large L pleural effusion on CXR. Still requiring NRB mask 8/25 L thoracentesis: LDH 367, protein 2.7, WBC 1996, 72% neutrophils 8/25 changed from heparin to rivaroxaban 8/25 increasing bilateral pulm infiltrates - empiric steroids and abx ordered. Procalcitonin protocol initiated 8/26 Slightly less dyspneic but still requires NRB and intermittent NPPV. PCT 0.66, 0.57 8/27 Less dyspneic. Able to wean from NRB to VM. CXR shows improvement in infiltrates 8/28 Markedly improved CXR. Able to maintain sats on [place] O2. Transfer to med-surg bed ordered  LINES / TUBES: R groin sheath 8/22 >> 8/23  CULTURES: Urine 8/21 >> NEG Blood 8/21 >> NEG Sputum 8/25 >> no specimen available Blood 8/25 >> NEG Pleural fluid 8/25 >> NEG  ANTIBIOTICS: Vanc 8/21 >> 8/24 Cefepmine 8/21>> 8/24 Azithro 8/21 >>  8/24 Vanc 8/25 >> 8/27 Levofloxacin 8/25 >> 8/28   SUBJECTIVE:   Continues to improve. Still with NP cough. No distress. No new complaints  VITAL SIGNS: Temp:  [97.8 F (36.6 C)-98.8 F (37.1 C)] 98.4 F (36.9 C) (08/29 4098) Pulse Rate:  [80-93] 87 (08/29 0614) Resp:  [15-25] 22 (08/29 0614) BP: (76-125)/(53-75) 115/75 mmHg (08/29 0614) SpO2:  [91 %-99 %] 98 % (08/29 0614) Weight:  [73.5 kg (162 lb 0.6 oz)] 73.5 kg (162 lb 0.6 oz) (08/29 0614) HEMODYNAMICS: CV stable   [place] 6L sats 98%    INTAKE / OUTPUT: Intake/Output     08/28 0701 - 08/29 0700 08/29 0701 - 08/30 0700   P.O.  120   I.V. (mL/kg) 90 (1.2)    IV Piggyback     Total Intake(mL/kg) 90 (1.2) 120 (1.6)   Urine (mL/kg/hr) 1320 (0.7)    Total Output 1320     Net -1230 +120        Urine Occurrence 4 x 2 x     PHYSICAL EXAMINATION: General: NAD, intermittent cough Neuro: no focal deficits HEENT: WNL Neck: no JVD noted Cardiovascular: reg, no M Lungs: faint bibasilar crackles Abdomen:  Soft, +BS Ext:  RLE cast, no edema noted  LABS: I have reviewed all of today's lab results. Relevant abnormalities are discussed in the A/P section  CXR:  8/29 Persistent bilateral lower lobe opacities with mildly improved aeration on the right.  ASSESSMENT / PLAN:  PULMONARY A: Acute Hypoxic Resp Failure Submassive PE - s/p catheter directed lysis 8/22 Bilateral pulm infiltrates/pneumonitis - appear to be steroid responsive Exudative L pleural effusion - s/p thoracentesis 8/25 Moderately severe nonproductive cough P:   Cont rivaroxaban initiated 8/25 Cont to wean O2 as tolerated for SpO2 > 90 % Cont empiric steroids initiated 8/25 with slow taper Codeine - promethazine syrup PRN ordered 8/28  CARDIOVASCULAR A: H/O hypertension - no anti-hypertensive meds listed on med rec Hyperlipidemia P:   Continue statin  RENAL Hyponatremia, resolved Hypokalemia, resolved P:   Monitor BMET intermittently Monitor  I/Os Correct electrolytes as indicated  GASTROINTESTINAL A: No acute issues P:  Cont reg diet  HEMATOLOGIC A: Anemia without acute bleeding P:  Cont rivaroxaban Monitor CBC intermittently Transfuse per usual ICU guidelines  INFECTIOUS A: Extensive pulmonary infiltrates - doubt infectious (Low PCT) P:   Monitor temp, WBC count Micro and abx as above  ENDOCRINE A: Hx Hyperparathyroidism  P:   Monitor Ca  NEUROLOGIC A: Depression Deconditioning P:   Cont Home Meds PT eval 8/28  TODAY'S SUMMARY:   To remain on PCCM service Taper steroids as indicated by clinical course Might require short term home O2 after discharge Has F/U appointment with surgeon in [place] 9/01. If not discharged first of next week, this will need to be rescheduled. May need either [facility] or SNF rehab.  Dorcas Carrow Beeper  [phone]  Cell  [phone]  If no response or cell goes to voicemail, call beeper [phone]  10/31/2013 11:46 AM

## 2013-10-31 NOTE — Progress Notes (Signed)
NURSING PROGRESS NOTE  Naja Apperson [id number] Transfer Data: 10/31/2013 6:28 AM Attending Provider: Leslye Peer, MD PCP:No primary provider on file. Code Status: full code  Linah Klapper is a 68 y.o. female patient transferred from [facility]  -No acute distress noted.  -No complaints of shortness of breath.  -No complaints of chest pain.   Cardiac Monitoring: Box # N/A  Cardiac monitor yield;N/A  Last Documented Vital Signs: Blood pressure 115/75, pulse 87, temperature 98.4 F (36.9 C), temperature source Oral, resp. rate 22, height  (1.626 m), weight 73.5 kg (162 lb 0.6 oz), SpO2 98.00%.  IV Fluids:  IV in place, occlusive dsg intact without redness, IV cath antecubital right, condition patent and no redness none.   Allergies:  Review of patient's allergies indicates no known allergies.  Past Medical History:   has a past medical history of Pulmonary embolism (10/2013); Hyperparathyroidism; HTN (hypertension); Hyperlipidemia; and Depression.  Past Surgical History:   has past surgical history that includes Parathyroidectomy and ORIF ankle fracture.  Social History:   reports that she has never smoked. She does not have any smokeless tobacco history on file. She reports that she does not drink alcohol or use illicit drugs.  Skin: NSI  Patient/Family orientated to room. Information packet given to patient/family. Admission inpatient armband information verified with patient/family to include name and date of birth and placed on patient arm. Side rails up x 2, fall assessment and education completed with patient/family. Patient/family able to verbalize understanding of risk associated with falls and verbalized understanding to call for assistance before getting out of bed. Call light within reach. Patient/family able to voice and demonstrate understanding of unit orientation instructions.    Will continue to evaluate and treat per MD orders.

## 2013-10-31 NOTE — Progress Notes (Addendum)
Patient was transferred to [facility] with all patient belongings. Report given to [facility].  Constance Haw Rosanne   Patients husband updated on patients new room.

## 2013-11-01 ENCOUNTER — Inpatient Hospital Stay (HOSPITAL_COMMUNITY): Payer: Medicare Other

## 2013-11-01 LAB — BASIC METABOLIC PANEL
Anion gap: 14 (ref 5–15)
BUN: 19 mg/dL (ref 6–23)
CO2: 29 mEq/L (ref 19–32)
Calcium: 9.2 mg/dL (ref 8.4–10.5)
Chloride: 95 mEq/L — ABNORMAL LOW (ref 96–112)
Creatinine, Ser: 0.87 mg/dL (ref 0.50–1.10)
GFR calc Af Amer: 78 mL/min — ABNORMAL LOW (ref >90)
GFR calc non Af Amer: 67 mL/min — ABNORMAL LOW (ref >90)
Glucose, Bld: 76 mg/dL (ref 70–99)
Potassium: 3.6 mEq/L — ABNORMAL LOW (ref 3.7–5.3)
Sodium: 138 mEq/L (ref 137–147)

## 2013-11-01 LAB — CBC
HCT: 31.1 % — ABNORMAL LOW (ref 36.0–46.0)
Hemoglobin: 9.9 g/dL — ABNORMAL LOW (ref 12.0–15.0)
MCH: 28 pg (ref 26.0–34.0)
MCHC: 31.8 g/dL (ref 30.0–36.0)
MCV: 88.1 fL (ref 78.0–100.0)
Platelets: 670 10*3/uL — ABNORMAL HIGH (ref 150–400)
RBC: 3.53 MIL/uL — ABNORMAL LOW (ref 3.87–5.11)
RDW: 15.5 % (ref 11.5–15.5)
WBC: 18.3 10*3/uL — ABNORMAL HIGH (ref 4.0–10.5)

## 2013-11-01 MED ORDER — PREDNISONE 20 MG PO TABS
40.0000 mg | ORAL_TABLET | Freq: Every day | ORAL | Status: DC
  Administered 2013-11-01 – 2013-11-02 (×2): 40 mg via ORAL
  Filled 2013-11-01 (×3): qty 2

## 2013-11-01 MED ORDER — POLYETHYLENE GLYCOL 3350 17 G PO PACK
17.0000 g | PACK | Freq: Every day | ORAL | Status: DC
  Administered 2013-11-01 – 2013-11-02 (×2): 17 g via ORAL
  Filled 2013-11-01 (×2): qty 1

## 2013-11-01 NOTE — Progress Notes (Signed)
Pt has had no BM since 8/25. Delford Field MD made aware.

## 2013-11-01 NOTE — Progress Notes (Signed)
PULMONARY / CRITICAL CARE MEDICINE HISTORY AND PHYSICAL EXAMINATION   Name: Christy Short MRN: [id number] DOB: 30-Dec-1945    ADMISSION DATE:  10/23/2013  PRIMARY SERVICE: PCCM  CHIEF COMPLAINT:  SOB   BRIEF PATIENT DESCRIPTION: 21 F transferred from [facility] with recent dx of PE after R ankle fx and worsening hypoxemia.  SIGNIFICANT EVENTS / STUDIES:                             PRIOR TO ADMISSION 8/18 CT ([facility]): PE present, other details not available   8/19 Lower Extremity Doppler (+) DVT  8/18 Echocardiogram:  EF of > 70%, a moderately dilated RV, and moderately reduced RV function.  ------------------------------------------------------------------------------------------------------------------------------------                            THIS ADMISSION 8/21 Admitted in transfer from [facility] with recent dx of PE after R ankle fx and worsening hypoxemia. 8/21 CTA chest: Large volume embolus including small saddle PE, evidence of RH strain, RV/LV ratio 1.1 8/22 catheter directed TPA lysis of PE 8/23 Echocardiogram: Left sdie normal. RV mildly dilated, RV syst function mildly reduced 8/25 Moderate to large L pleural effusion on CXR. Still requiring NRB mask 8/25 L thoracentesis: LDH 367, protein 2.7, WBC 1996, 72% neutrophils 8/25 changed from heparin to rivaroxaban 8/25 increasing bilateral pulm infiltrates - empiric steroids and abx ordered. Procalcitonin protocol initiated 8/26 Slightly less dyspneic but still requires NRB and intermittent NPPV. PCT 0.66, 0.57 8/27 Less dyspneic. Able to wean from NRB to VM. CXR shows improvement in infiltrates 8/28 Markedly improved CXR. Able to maintain sats on [place] O2. Transfer to med-surg bed ordered  LINES / TUBES: R groin sheath 8/22 >> 8/23  CULTURES: Urine 8/21 >> NEG Blood 8/21 >> NEG Sputum 8/25 >> no specimen available Blood 8/25 >> NEG Pleural fluid 8/25 >> NEG  ANTIBIOTICS: Vanc 8/21 >> 8/24 Cefepmine 8/21>> 8/24 Azithro 8/21 >>  8/24 Vanc 8/25 >> 8/27 Levofloxacin 8/25 >> 8/28   SUBJECTIVE:   Wants to go home, can only transfer bed to chair and use wheelchair for ambulation  VITAL SIGNS: Temp:  [98 F (36.7 C)-98.4 F (36.9 C)] 98 F (36.7 C) (08/30 0556) Pulse Rate:  [58-92] 81 (08/30 0556) Resp:  [20] 20 (08/29 2121) BP: (99-115)/(48-72) 115/68 mmHg (08/30 0556) SpO2:  [93 %-95 %] 93 % (08/30 0838) HEMODYNAMICS: CV stable   [place] 6L sats 98%    INTAKE / OUTPUT: Intake/Output     08/29 0701 - 08/30 0700 08/30 0701 - 08/31 0700   P.O. 342 120   I.V. (mL/kg)     Total Intake(mL/kg) 342 (4.7) 120 (1.6)   Urine (mL/kg/hr)  200 (0.8)   Total Output   200   Net +342 -80        Urine Occurrence 8 x 1 x     PHYSICAL EXAMINATION: General: NAD, intermittent cough Neuro: no focal deficits HEENT: WNL Neck: no JVD noted Cardiovascular: reg, no M Lungs: faint bibasilar crackles Abdomen:  Soft, +BS Ext:  RLE cast, no edema noted  LABS: I have reviewed all of today's lab results. Relevant abnormalities are discussed in the A/P section  CXR:  No film 8/30   ASSESSMENT / PLAN:  PULMONARY A: Acute Hypoxic Resp Failure Submassive PE - s/p catheter directed lysis 8/22 Bilateral pulm infiltrates/pneumonitis - appear to be steroid responsive Exudative L pleural  effusion - s/p thoracentesis 8/25 Moderately severe nonproductive cough P:   Cont rivaroxaban initiated 8/25 Cont to wean O2 as tolerated for SpO2 > 90 % Cont empiric steroids initiated 8/25 with slow taper Codeine - promethazine syrup PRN ordered 8/28 F/u PA/Lat CXR  CARDIOVASCULAR A: H/O hypertension - no anti-hypertensive meds listed on med rec Hyperlipidemia P:   Continue statin  RENAL Hyponatremia, resolved Hypokalemia, resolved P:   Monitor BMET intermittently Monitor I/Os Correct electrolytes as indicated  GASTROINTESTINAL A: obstipation P:  Cont reg diet Bowel program  HEMATOLOGIC A: Anemia without acute  bleeding P:  Cont rivaroxaban Monitor CBC intermittently Transfuse per usual ICU guidelines  INFECTIOUS A: Extensive pulmonary infiltrates - doubt infectious (Low PCT) P:   Monitor temp, WBC count Micro and abx as above  ENDOCRINE A: Hx Hyperparathyroidism  P:   Monitor Ca  NEUROLOGIC A: Depression Deconditioning P:   Cont Home Meds Set up [facility] PT RN  D/c planning  TODAY'S SUMMARY:   To remain on PCCM service Taper steroids as indicated by clinical course Might require short term home O2 after discharge Has F/U appointment with surgeon in [place] 9/01. If not discharged first of next week, this will need to be rescheduled. Suspect can be d/c to home 8/31 with [facility] RN PT   Dorcas Carrow Beeper  [phone]  Cell  [phone]  If no response or cell goes to voicemail, call beeper [phone]  11/01/2013 10:27 AM

## 2013-11-02 LAB — CBC WITH DIFFERENTIAL/PLATELET
Basophils Absolute: 0 10*3/uL (ref 0.0–0.1)
Basophils Relative: 0 % (ref 0–1)
EOS PCT: 1 % (ref 0–5)
Eosinophils Absolute: 0.2 10*3/uL (ref 0.0–0.7)
HCT: 31.7 % — ABNORMAL LOW (ref 36.0–46.0)
Hemoglobin: 10.2 g/dL — ABNORMAL LOW (ref 12.0–15.0)
Lymphocytes Relative: 15 % (ref 12–46)
Lymphs Abs: 2.9 10*3/uL (ref 0.7–4.0)
MCH: 28.2 pg (ref 26.0–34.0)
MCHC: 32.2 g/dL (ref 30.0–36.0)
MCV: 87.6 fL (ref 78.0–100.0)
MONO ABS: 1.2 10*3/uL — AB (ref 0.1–1.0)
Monocytes Relative: 6 % (ref 3–12)
Neutro Abs: 14.9 10*3/uL — ABNORMAL HIGH (ref 1.7–7.7)
Neutrophils Relative %: 78 % — ABNORMAL HIGH (ref 43–77)
PLATELETS: 668 10*3/uL — AB (ref 150–400)
RBC: 3.62 MIL/uL — AB (ref 3.87–5.11)
RDW: 15.3 % (ref 11.5–15.5)
WBC: 19.2 10*3/uL — AB (ref 4.0–10.5)

## 2013-11-02 LAB — BASIC METABOLIC PANEL
Anion gap: 11 (ref 5–15)
BUN: 18 mg/dL (ref 6–23)
CO2: 31 mEq/L (ref 19–32)
Calcium: 9.1 mg/dL (ref 8.4–10.5)
Chloride: 95 mEq/L — ABNORMAL LOW (ref 96–112)
Creatinine, Ser: 0.91 mg/dL (ref 0.50–1.10)
GFR calc Af Amer: 74 mL/min — ABNORMAL LOW (ref >90)
GFR calc non Af Amer: 64 mL/min — ABNORMAL LOW (ref >90)
Glucose, Bld: 73 mg/dL (ref 70–99)
Potassium: 3.6 mEq/L — ABNORMAL LOW (ref 3.7–5.3)
Sodium: 137 mEq/L (ref 137–147)

## 2013-11-02 MED ORDER — RIVAROXABAN (XARELTO) VTE STARTER PACK (15 & 20 MG): ORAL_TABLET | ORAL | Status: DC

## 2013-11-02 MED ORDER — POLYETHYLENE GLYCOL 3350 17 G PO PACK: 17.0000 g | PACK | Freq: Every day | ORAL | Status: DC

## 2013-11-02 MED ORDER — PROMETHAZINE-CODEINE 6.25-10 MG/5ML PO SYRP: 5.0000 mL | ORAL_SOLUTION | Freq: Four times a day (QID) | ORAL | Status: DC | PRN

## 2013-11-02 MED ORDER — POLYETHYLENE GLYCOL 3350 17 G PO PACK: 17.0000 g | PACK | Freq: Every day | ORAL | Status: AC

## 2013-11-02 MED ORDER — PREDNISONE 10 MG PO TABS: ORAL_TABLET | ORAL | Status: DC

## 2013-11-02 NOTE — Progress Notes (Signed)
SATURATION QUALIFICATIONS: (This note is used to comply with regulatory documentation for home oxygen)  Patient Saturations on Room Air at Rest = 93%  Patient Saturations on Room Air while Ambulating = 92%  Patient Saturations on 0 Liters of oxygen while Ambulating = 92%  Please briefly explain why patient needs home oxygen: 

## 2013-11-02 NOTE — Progress Notes (Signed)
Physical Therapy Treatment Patient Details Name: Christy Short MRN: [id number] DOB: [birth year] Today's Date: 11/02/2013    History of Present Illness 42 F with submassive PE in setting of worsening hypoxemia.  Has RLE DVT, L thoracentesis and EF >70%.    PT Comments    Patient continues to have difficulty with static standing balance due to drop in Sa02 and non compliance with WB status. With proper cues, able to stand for ~30 sec with use of RW without physical assist for last standing bout. Requires assist for all transfers. Education provided for pursed lip breathing during standing and transfers as pt tends to hold breath resulting in drop in 02 sats. Pt and husband declining ST SNF however willing to have HHPT at discharge to assist with bed mobility, transfers and gait. Progressing towards goals however pt very fearful of falling which is limiting mobility.   Follow Up Recommendations  Supervision/Assistance - 24 hour;Home health PT     Equipment Recommendations  Other (comment) (Recommend supplemental 02.)    Recommendations for Other Services       Precautions / Restrictions Precautions Precautions: Fall;Other (comment) Restrictions Weight Bearing Restrictions: Yes RLE Weight Bearing: Non weight bearing    Mobility  Bed Mobility Overal bed mobility: Needs Assistance Bed Mobility: Supine to Sit     Supine to sit: Min assist     General bed mobility comments: Required assist scooting bottom to EOB. Increased time due to fatigue. HOB flat, no rails to simulate home environment.  Transfers Overall transfer level: Needs assistance Equipment used: Rolling walker (2 wheeled);None Transfers: Sit to/from [employer] Sit to Stand: Mod assist Stand pivot transfers: Mod assist       General transfer comment: Stood from EOB x4 with increased posterior lean upon standing, non compliant with WB status initially upon standing. VC to shift weight anteriorly. Able  to stand unsupported on last standing bout for ~30 sec, other trials required Min A. VC to breathe as pt tends to hold breath in standing. Sa02 drops to 79% consistently when standing. Improves with seated rest break. SPT bed<->BSC, bed<--> chair mod A and VC.  Ambulation/Gait                 Stairs            Wheelchair Mobility    Modified Rankin (Stroke Patients Only)       Balance Overall balance assessment: Needs assistance Sitting-balance support: Bilateral upper extremity supported Sitting balance-Leahy Scale: Fair     Standing balance support: During functional activity;Bilateral upper extremity supported Standing balance-Leahy Scale: Poor Standing balance comment: Requires support for BUEs upon standing with increased posterior lean and Mod A to maintain upright balance. Static standing balance ~45 sec, ~25 sec, ~30 sec. On 3rd standing bout, able to stand without physical assist for 30 sec, however required Min A from therapist on first 2 trials due to posterior lean. Noncompliant with WB through RLE.                    Cognition Arousal/Alertness: Awake/alert Behavior During Therapy: WFL for tasks assessed/performed Overall Cognitive Status: Within Functional Limits for tasks assessed                      Exercises      General Comments General comments (skin integrity, edema, etc.): Pt and husband adamant about returning home.      Pertinent Vitals/Pain Pain Assessment: No/denies pain  Home Living                      Prior Function            PT Goals (current goals can now be found in the care plan section) Progress towards PT goals: Progressing toward goals    Frequency  Min 3X/week    PT Plan Current plan remains appropriate    Co-evaluation             End of Session Equipment Utilized During Treatment: Gait belt;Oxygen Activity Tolerance: Patient limited by fatigue;Patient tolerated treatment  well Patient left: in chair;with call bell/phone within reach;with family/visitor present     Time: 1125-1203 PT Time Calculation (min): 38 min  Charges:  $Therapeutic Exercise: 8-22 mins $Therapeutic Activity: 23-37 mins                    G CodesAlvie Heidelberg A 11-03-13, 12:40 PM Alvie Heidelberg, PT, DPT [phone]

## 2013-11-02 NOTE — Discharge Summary (Addendum)
Physician Discharge Summary  Patient ID: Emmery Seiler MRN: [id number] DOB/AGE: 06/04/1945 68 y.o.  Admit date: 10/23/2013 Discharge date: 11/02/2013    Discharge Diagnoses:  Principal Problem:   PE (pulmonary embolism) Active Problems:   Pleural effusion   Pulmonary infiltrates on CXR   Acute on chronic respiratory failure with hypoxia                                                                    D/C Plan by Discharge Diagnosis  Acute Hypoxic Resp Failure at admission but now Acute on Chronic Respiratory Failure by time of discharge   - Submassive PE - s/p catheter directed lysis 8/22   - Bilateral pulm infiltrates/pneumonitis - appear to be steroid responsive   - Exudative L pleural effusion - s/p thoracentesis 8/25   - Moderately severe nonproductive cough  - Extensive pulmonary infiltrates - doubt infectious (Low PCT) - s/p course abx as above  D/c plan --  Cont rivaroxaban initiated 8/25  Home O2 Cont empiric steroids initiated 8/25 with slow taper  Codeine - promethazine syrup PRN  F/u PA/Lat CXR as outpt * outpt Pulm f/u as below  Home pt/ot    Hx HTN Hyperlipidemia  D/c plan --  Cont statin   Anemia - mild.  No acute bleeding.  D/c plan --  Cont rivaroxaban  F/u cbc as outpt *  Depression  Deconditioning D/c plan --  Home health pt/ot  Cont home anti-depressant  R ankle fx  D/c plan --  outpt f/u as prev scheduled with ortho     Brief Summary: Christy Short is a 68 y.o. y/o female with a PMH of HTN with recent complex R ankle fx and subsequent PE.  First admitted to [facility] 8/18 with chest pain and SOB.  Found to have RLE DVT and submassive PE. She cont to have worsening hypoxemia and was tx to [facility] 8/21.  F/u CT at [facility] revealed large volume embolus including small saddle PE and RH strain.  She was continued on heparin gtt and underwent catheter direct TPA lysis per IR on 8/22.  She continued to have worsening hypoxemic respiratory failure  requiring bipap/NRB with increasing bilateral infiltrates.  She was treated with empiric abx as below.  Developed worsening L pleural effusion and underwent thoracentesis 8/25 which revealed exudative effusion which was culture negative.  Post thoracentesis she was transitioned from heparin gtt to rivaroxaban.   She was ultimately started on empiric steroids 8/25 with slow overall improvement.  She is now on 2L [place] and tolerating physical therapy, is off abx and tolerating rivaroxaban.  She remains very deconditioned, SNF was recommended and declined.  She will need continued rehab efforts at home as well as home O2 on account of acute on chronic respiratory failure.  She will f/u in the pulmonary office in 1 week as well as f/u with PC and with ortho for R ankle fx.    SIGNIFICANT EVENTS / STUDIES:  PRIOR TO ADMISSION  8/18 CT ([facility]): PE present, other details not available  8/19 Lower Extremity Doppler (+) DVT  8/18 Echocardiogram: EF of > 70%, a moderately dilated RV, and moderately reduced RV function.  ------------------------------------------------------------------------------------------------------------------------------------  THIS ADMISSION  8/21 Admitted in transfer from  [facility] with recent dx of PE after R ankle fx and worsening hypoxemia.  8/21 CTA chest: Large volume embolus including small saddle PE, evidence of RH strain, RV/LV ratio 1.1  8/22 catheter directed TPA lysis of PE  8/23 Echocardiogram: Left sdie normal. RV mildly dilated, RV syst function mildly reduced  8/25 Moderate to large L pleural effusion on CXR. Still requiring NRB mask  8/25 L thoracentesis: LDH 367, protein 2.7, WBC 1996, 72% neutrophils  8/25 changed from heparin to rivaroxaban  8/25 increasing bilateral pulm infiltrates - empiric steroids and abx ordered. Procalcitonin protocol initiated  8/26 Slightly less dyspneic but still requires NRB and intermittent NPPV. PCT 0.66, 0.57  8/27 Less dyspneic. Able  to wean from NRB to VM. CXR shows improvement in infiltrates  8/28 Markedly improved CXR. Able to maintain sats on [place] O2. Transfer to med-surg   LINES / TUBES:  R groin sheath 8/22 >> 8/23   CULTURES:  Urine 8/21 >> NEG  Blood 8/21 >> NEG  Sputum 8/25 >> no specimen available  Blood 8/25 >> NEG  Pleural fluid 8/25 >> NEG   ANTIBIOTICS:  Vanc 8/21 >> 8/24  Cefepmine 8/21>> 8/24  Azithro 8/21 >> 8/24  Vanc 8/25 >> 8/27  Levofloxacin 8/25 >> 8/28     Filed Vitals:   11/01/13 2126 11/02/13 0239 11/02/13 0525 11/02/13 0630  BP: 125/53  104/57   Pulse: 96  86   Temp: 98.4 F (36.9 C)  98.3 F (36.8 C)   TempSrc: Oral  Oral   Resp: 18  18   Height:      Weight:      SpO2: 95% 96% 96% 95%     Discharge Labs  BMET  Recent Labs Lab 10/27/13 0344 10/29/13 0316 10/30/13 0329 11/01/13 0602 11/02/13 0550  NA 134* 136* 140 138 137  K 4.1 3.4* 4.0 3.6* 3.6*  CL 96 95* 100 95* 95*  CO2 _0 GLUCOSE 80 151* 104* 76 73  BUN _1 CREATININE 0.68 0.70 0.79 0.87 0.91  CALCIUM 9.2 9.1 8.8 9.2 9.1     CBC   Recent Labs Lab 10/30/13 0329 11/01/13 0602 11/02/13 0550  HGB 8.7* 9.9* 10.2*  HCT 27.1* 31.1* 31.7*  WBC 18.5* 18.3* 19.2*  PLT 542* 670* 668*   Anti-Coagulation No results found for this basename: INR,  in the last 168 hours     FU - See Tammy Parrett and then see Dr Joya Gaskins       Follow-up Information   Follow up with [facility], NP On 11/10/2013. (11:15am -- [facility] )    Specialty:  Nurse Practitioner   Contact information:   [address] [address] [phone]       Follow up with Lilian Coma., MD On 11/23/2013. [phone]   2:30pm )    Specialty:  Internal Medicine   Contact information:   [address] [place] [address] [phone]          Medication List         alendronate 70 MG tablet  Commonly known as:  FOSAMAX  Take 70 mg by mouth every Thursday. Take with a  full glass of water on an empty stomach.     atorvastatin 40 MG tablet  Commonly known as:  LIPITOR  Take 20 mg by mouth daily at 6 PM.     buPROPion 150 MG 24 hr tablet  Commonly  known as:  WELLBUTRIN XL  Take 450 mg by mouth daily.     cyclobenzaprine 5 MG tablet  Commonly known as:  FLEXERIL  Take 5 mg by mouth 3 (three) times daily.     fluticasone 50 MCG/ACT nasal spray  Commonly known as:  FLONASE  Place 1 spray into both nostrils daily as needed for allergies or rhinitis.     ondansetron 8 MG tablet  Commonly known as:  ZOFRAN  Take 8 mg by mouth 2 (two) times daily as needed for nausea or vomiting.     oxyCODONE-acetaminophen 5-325 MG per tablet  Commonly known as:  PERCOCET/ROXICET  Take 1-2 tablets by mouth every 6 (six) hours as needed for severe pain (pain).     oxymetazoline 0.05 % nasal spray  Commonly known as:  AFRIN  Place 1 spray into both nostrils 2 (two) times daily as needed for congestion.     polyethylene glycol packet  Commonly known as:  MIRALAX / GLYCOLAX  Take 17 g by mouth daily.     predniSONE 10 MG tablet  Commonly known as:  DELTASONE  Take 4 tabs PO daily x 4 days, then 3 tabs PO daily x 5 days, then 2 tabs PO daily x 5 days, then 1 tab PO daily x 5 days then, STOP     promethazine-codeine 6.25-10 MG/5ML syrup  Commonly known as:  PHENERGAN with CODEINE  Take 5 mLs by mouth every 6 (six) hours as needed for cough.     Rivaroxaban 15 & 20 MG Tbpk  Commonly known as:  XARELTO STARTER PACK  Take as directed: Start with one 30m tablet by mouth twice a day with food. On 11/18/13, switch to one 230mtablet once a day with food.     venlafaxine XR 150 MG 24 hr capsule  Commonly known as:  EFFEXOR-XR  Take 300 mg by mouth daily.          Disposition:  Home with home health   Discharged Condition: LiMichalina Calbertas met maximum benefit of inpatient care and is medically stable and cleared for discharge.  Patient is pending follow up as  above.      Time spent on disposition:  Greater than 35 minutes.   Signed: Darlina SicilianNP 11/02/2013  9:53 AM Pager: [phone] or [phone] STaff note  -Ok to go home. She will see my NP at fu after that Dr WrJoya Gaskinsor MD fu in pulmonary. She is deconditioned but should improve with PT as opd  Dr. MuBrand MalesM.D., F.Bhc [facility].P Pulmonary and Critical Care Medicine Staff Physician [facility] Pager: [phone] no answer or between  15:00h - 7:00h: call [phone]  11/02/2013 12:11 PM

## 2013-11-10 ENCOUNTER — Ambulatory Visit (INDEPENDENT_AMBULATORY_CARE_PROVIDER_SITE_OTHER)
Admission: RE | Admit: 2013-11-10 | Discharge: 2013-11-10 | Disposition: A | Discharge status: Home / Self Care | Payer: Medicare Other | Source: Ambulatory Visit | Attending: Adult Health | Admitting: Adult Health

## 2013-11-10 ENCOUNTER — Ambulatory Visit (INDEPENDENT_AMBULATORY_CARE_PROVIDER_SITE_OTHER): Payer: Medicare Other | Admitting: Adult Health

## 2013-11-10 ENCOUNTER — Encounter: Payer: Self-pay | Admitting: Adult Health

## 2013-11-10 VITALS — BP 112/64 | HR 93 | Temp 98.8°F | Ht 64.0 in | Wt 147.2 lb

## 2013-11-10 DIAGNOSIS — J90 Pleural effusion, not elsewhere classified: Secondary | ICD-10-CM

## 2013-11-10 DIAGNOSIS — Z23 Encounter for immunization: Secondary | ICD-10-CM

## 2013-11-10 DIAGNOSIS — I2699 Other pulmonary embolism without acute cor pulmonale: Secondary | ICD-10-CM

## 2013-11-10 DIAGNOSIS — R918 Other nonspecific abnormal finding of lung field: Secondary | ICD-10-CM

## 2013-11-10 MED ORDER — CLOTRIMAZOLE 10 MG MT TROC: 10.0000 mg | Freq: Every day | OROMUCOSAL | Status: DC

## 2013-11-10 MED ORDER — RIVAROXABAN 20 MG PO TABS: 20.0000 mg | ORAL_TABLET | Freq: Every day | ORAL | Status: DC

## 2013-11-10 NOTE — Patient Instructions (Addendum)
Continue on Xarelto  Twice daily  , then on  11/18/13 begin   daily after that.  A prescription at the pharmacy has been verified that you have it.  Call if you have any bleeding at all , report immediately.  Finish steroid taper as directed.  Mycelex troches five times daily for 1 week for thrush.  Follow up Dr. Delford Field  In 4 weeks and As needed  - In [facility] office w/ chest xray  Please contact office for sooner follow up if symptoms do not improve or worsen or seek emergency care

## 2013-11-11 MED ORDER — RIVAROXABAN 20 MG PO TABS
20.0000 mg | ORAL_TABLET | Freq: Every day | ORAL | Status: DC
Start: 2013-11-11 — End: 2014-04-22

## 2013-11-12 ENCOUNTER — Telehealth: Payer: Self-pay | Admitting: Critical Care Medicine

## 2013-11-12 NOTE — Telephone Encounter (Signed)
Per 11/10/13 w/ TP Patient Instructions      Continue on Xarelto  Twice daily  , then on  11/18/13 begin   daily after that.     I called made pt aware of above. She voiced her understanding. Nothing further needed

## 2013-11-12 NOTE — Telephone Encounter (Signed)
Error

## 2013-11-12 NOTE — Progress Notes (Signed)
Christy Lina, MD, PhD 11/12/2013, 9:08 AM [facility] [phone] or if no answer [phone]  

## 2013-11-13 NOTE — Assessment & Plan Note (Signed)
Continue on Xarelto  Twice daily  , then on  11/18/13 begin   daily after that.  A prescription at the pharmacy has been verified that you have it.  Call if you have any bleeding at all , report immediately.  Finish steroid taper as directed.  Mycelex troches five times daily for 1 week for thrush.  Follow up Dr. Delford Field  In 4 weeks and As needed  - In [facility] office w/ chest xray  Please contact office for sooner follow up if symptoms do not improve or worsen or seek emergency care

## 2013-11-13 NOTE — Progress Notes (Signed)
   Subjective:    Patient ID: Christy Short, female    DOB: July 02, 1945, 68 y.o.   MRN: [id number]  HPI 68 yo female seen for initial  PCCM consult for admission 10/23/2013 for PE and RLE DVT  after complex R ankle fx .   11/10/13 Post Hospital follow up  Pt was admitted 8/21-8/31 for acute PE w/ acute hypoxic RF . Admitted with acute dyspnea . Found to have a submassive PE ,w/ evidence of Right heart strain. She underwent cath directed thrombolytics . She was started on Xarelto on 8/25.  CT showed bilateral infiltrates and was started on IV abx. Developed a subsequent Left pleural exudative effusion requiring thoracentesis . She was started on steroid challenge for possible ALI . Did require BIPAP intermittently  w/ transition to [place] O2.  .  Since discharge she is improving but still weak and low energy.  Taking Xarelto  Twice daily  , has  daily to begin on 9/16. We sent additional refills to pharm for the  daily dose and explained to pt they will need to pick this up to continue therapy for this .  CXR today shows resolution of pleural effusions , evolving pulmonary infarcts /pulmonary parenchymal scarring.  Denies chest pain, hemoptysis, edema or n/v/d , no bleeding.      Review of Systems Constitutional:   No  weight loss, night sweats,  Fevers, chills,  +fatigue, or  lassitude.  HEENT:   No headaches,  Difficulty swallowing,  Tooth/dental problems, or  Sore throat,                No sneezing, itching, ear ache, nasal congestion, post nasal drip,   CV:  No chest pain,  Orthopnea, PND, swelling in lower extremities, anasarca, dizziness, palpitations, syncope.   GI  No heartburn, indigestion, abdominal pain, nausea, vomiting, diarrhea, change in bowel habits, loss of appetite, bloody stools.   Resp:   No non-productive cough,  No coughing up of blood.  No change in color of mucus.  No wheezing.  No chest wall deformity  Skin: no rash or lesions.  GU: no dysuria, change in color  of urine, no urgency or frequency.  No flank pain, no hematuria   MS:  No joint pain or swelling.  No decreased range of motion.  No back pain.  Psych:  No change in mood or affect. No depression or anxiety.  No memory loss.         Objective:   Physical Exam GEN: A/Ox3; pleasant , NAD, well nourished , elderly , deconditioned   HEENT:  [place]/AT,  EACs-clear, TMs-wnl, NOSE-clear, THROAT-clear, no lesions, no postnasal drip or exudate noted. Post phaynx w/ white patches c/w thrush   NECK:  Supple w/ fair ROM; no JVD; normal carotid impulses w/o bruits; no thyromegaly or nodules palpated; no lymphadenopathy.  RESP  Clear  P & A; w/o, wheezes/ rales/ or rhonchi.no accessory muscle use, no dullness to percussion  CARD:  RRR, no m/r/g  , no peripheral edema, pulses intact, no cyanosis or clubbing.  GI:   Soft & nt; nml bowel sounds; no organomegaly or masses detected.  Musco: Warm bil, no deformities or joint swelling noted.   Neuro: alert, no focal deficits noted.    Skin: Warm, no lesions or rashes         Assessment & Plan:

## 2013-11-13 NOTE — Assessment & Plan Note (Addendum)
?   Related to PE/lung infacts  vs Pneumonitis   Plan  Continue on Xarelto  Twice daily  , then on  11/18/13 begin   daily after that.  A prescription at the pharmacy has been verified that you have it.  Call if you have any bleeding at all , report immediately.  Finish steroid taper as directed.  Mycelex troches five times daily for 1 week for thrush.  Follow up Dr. Delford Field  In 4 weeks and As needed  - In [facility] office w/ chest xray  Please contact office for sooner follow up if symptoms do not improve or worsen or seek emergency care

## 2013-11-18 ENCOUNTER — Telehealth: Payer: Self-pay | Admitting: Critical Care Medicine

## 2013-11-18 MED ORDER — NYSTATIN 100000 UNIT/ML MT SUSP: 400000.0000 [IU] | Freq: Three times a day (TID) | OROMUCOSAL | Status: DC

## 2013-11-18 NOTE — Telephone Encounter (Addendum)
Spoke with the pt  She states TP prescribed mycelex troches to take 5 x daily x 1 wk on 11/10/13  She states that med was prescribed b/c she had white coating on her tongue  She states that she is no better and is unable to taste anything  She states that the warmed up some food last night and when she ate it she couldn't tell the food was hot  KC- please advise, thanks! No Known Allergies

## 2013-11-18 NOTE — Telephone Encounter (Signed)
Pt is checking on the status of this.  Christy Short

## 2013-11-18 NOTE — Telephone Encounter (Signed)
Called spoke with pt. Aware of recs. RX sent in. Nothing further needed 

## 2013-11-18 NOTE — Telephone Encounter (Signed)
Try nystatin suspension 400,000 IU swish and swallow tid for 5 days.  QS, no fills.

## 2013-12-10 ENCOUNTER — Ambulatory Visit (INDEPENDENT_AMBULATORY_CARE_PROVIDER_SITE_OTHER): Payer: Medicare Other | Admitting: Critical Care Medicine

## 2013-12-10 ENCOUNTER — Encounter: Payer: Self-pay | Admitting: Critical Care Medicine

## 2013-12-10 ENCOUNTER — Ambulatory Visit (HOSPITAL_BASED_OUTPATIENT_CLINIC_OR_DEPARTMENT_OTHER)
Admission: RE | Admit: 2013-12-10 | Discharge: 2013-12-10 | Disposition: A | Discharge status: Home / Self Care | Payer: Medicare Other | Source: Ambulatory Visit | Attending: Critical Care Medicine | Admitting: Critical Care Medicine

## 2013-12-10 VITALS — BP 132/66 | HR 104 | Ht 64.0 in | Wt 159.0 lb

## 2013-12-10 DIAGNOSIS — I10 Essential (primary) hypertension: Secondary | ICD-10-CM | POA: Insufficient documentation

## 2013-12-10 DIAGNOSIS — I2699 Other pulmonary embolism without acute cor pulmonale: Secondary | ICD-10-CM

## 2013-12-10 DIAGNOSIS — J90 Pleural effusion, not elsewhere classified: Secondary | ICD-10-CM

## 2013-12-10 DIAGNOSIS — Z86711 Personal history of pulmonary embolism: Secondary | ICD-10-CM

## 2013-12-10 DIAGNOSIS — R918 Other nonspecific abnormal finding of lung field: Secondary | ICD-10-CM

## 2013-12-10 NOTE — Patient Instructions (Signed)
Stay on xarelto daily  No other changes Return 2 months Chest xray today

## 2013-12-10 NOTE — Progress Notes (Signed)
Quick Note:  Notify the patient that the Xray is stable and no pneumonia or other acute process No change in medications are recommended. Continue current meds as prescribed at last office visit ______ 

## 2013-12-10 NOTE — Progress Notes (Signed)
Subjective:    Patient ID: Christy Short, female    DOB: 1945/10/12, 68 y.o.   MRN: [id number]  HPI  68 yo female seen for initial  PCCM consult for admission 10/23/2013 for PE and RLE DVT  after complex R ankle fx .   11/10/13 Post Hospital follow up  Pt was admitted 8/21-8/31 for acute PE w/ acute hypoxic RF . Admitted with acute dyspnea . Found to have a submassive PE ,w/ evidence of Right heart strain. She underwent cath directed thrombolytics . She was started on Xarelto on 8/25.  CT showed bilateral infiltrates and was started on IV abx. Developed a subsequent Left pleural exudative effusion requiring thoracentesis . She was started on steroid challenge for possible ALI . Did require BIPAP intermittently  w/ transition to [place] O2.  .  Since discharge she is improving but still weak and low energy.  Taking Xarelto 15mg  Twice daily  , has 20mg  daily to begin on 9/16. We sent additional refills to pharm for the 20mg  daily dose and explained to pt they will need to pick this up to continue therapy for this .  CXR today shows resolution of pleural effusions , evolving pulmonary infarcts /pulmonary parenchymal scarring.  Denies chest pain, hemoptysis, edema or n/v/d , no bleeding.   12/10/2013 Chief Complaint  Patient presents with  . Follow-up    Pt c/o nonprod cough, no other complaints at this time.   Pt in [facility] for two days and then to [facility] for admission  Doing well   Dyspnea is better.  Min dry cough.  No other issues. No prior blood clots. Pt had ankle fx in three places July 10, then developed clots in august  Sequence of events after tfr to [facility] 10/2013 as below (taken from progress notes and verified) 8/18 CT ([facility]): PE present, other details not available  8/19 Lower Extremity Doppler (+) DVT  8/18 Echocardiogram: EF of > 70%, a moderately dilated RV, and moderately reduced RV function.    ------------------------------------------------------------------------------------------------------------------------------------  THIS ADMISSION  8/21 Admitted in transfer from [facility] with recent dx of PE after R ankle fx and worsening hypoxemia.  8/21 CTA chest: Large volume embolus including small saddle PE, evidence of RH strain, RV/LV ratio 1.1  8/22 catheter directed TPA lysis of PE  8/23 Echocardiogram: Left sdie normal. RV mildly dilated, RV syst function mildly reduced  8/25 Moderate to large L pleural effusion on CXR. Still requiring NRB mask  8/25 L thoracentesis: LDH 367, protein 2.7, WBC 1996, 72% neutrophils  8/25 changed from heparin to rivaroxaban  8/25 increasing bilateral pulm infiltrates - empiric steroids and abx ordered. Procalcitonin protocol initiated  8/26 Slightly less dyspneic but still requires NRB and intermittent NPPV. PCT 0.66, 0.57  8/27 Less dyspneic. Able to wean from NRB to VM. CXR shows improvement in infiltrates  8/28 Markedly improved CXR. Able to maintain sats on [place] O2. Transfer to med-surg     Review of Systems  Constitutional:   No  weight loss, night sweats,  Fevers, chills,  +fatigue, or  lassitude.  HEENT:   No headaches,  Difficulty swallowing,  Tooth/dental problems, or  Sore throat,                No sneezing, itching, ear ache, nasal congestion, post nasal drip,   CV:  No chest pain,  Orthopnea, PND, swelling in lower extremities, anasarca, dizziness, palpitations, syncope.   GI  No heartburn, indigestion, abdominal pain, nausea, vomiting, diarrhea,  change in bowel habits, loss of appetite, bloody stools.   Resp:   No non-productive cough,  No coughing up of blood.  No change in color of mucus.  No wheezing.  No chest wall deformity  Skin: no rash or lesions.  GU: no dysuria, change in color of urine, no urgency or frequency.  No flank pain, no hematuria   MS:  No joint pain or swelling.  No decreased range of motion.  No back  pain.  Psych:  No change in mood or affect. No depression or anxiety.  No memory loss.     Objective:   Physical Exam  Filed Vitals:   12/10/13 1057  BP: 132/66  Pulse: 104  Height: 5\' 4"  (1.626 m)  Weight: 159 lb (72.122 kg)  SpO2: 98%    Gen: Pleasant, well-nourished, in no distress,  normal affect  ENT: No lesions,  mouth clear,  oropharynx clear, no postnasal drip  Neck: No JVD, no TMG, no carotid bruits  Lungs: No use of accessory muscles, no dullness to percussion, clear without rales or rhonchi  Cardiovascular: RRR, heart sounds normal, no murmur or gallops, no peripheral edema  Abdomen: soft and NT, no HSM,  BS normal  Musculoskeletal: No deformities, no cyanosis or clubbing  Neuro: alert, non focal  Skin: Warm, no lesions or rashes  Dg Chest 2 View  12/10/2013   CLINICAL DATA:  Recent pulmonary emboli.  Hypertension  EXAM: CHEST  2 VIEW  COMPARISON:  November 10, 2013  FINDINGS: There has been significant clearing of opacities from the left base which appear consistent with a combination of infiltrate and pulmonary infarcts. Patchy opacity remains in the left mid and lower lung zone, most likely representing residua of infiltrate and infarcts. There is mild scarring in the right upper lobe which is stable. Right lung is otherwise clear. Heart is upper normal in size with pulmonary vascularity within normal limits. No pneumothorax. No adenopathy. No bone lesions.  IMPRESSION: Significant partial clearing opacity from the left mid and lower lung zones, consistent with resolving infiltrate infarcts. Stable mild scarring right upper lobe. No new opacity. No change in cardiac silhouette.   Electronically Signed   By: Bretta BangWilliam  Woodruff M.D.   On: 12/10/2013 12:39       Assessment & Plan:   Personal history of pulmonary embolism Hx of submassive PE s/p TPA intracatheter Provoked PE with R leg ankle fx Plan 3 months Rx with xarelto   Pleural effusion Pleural effusion  parapneumonic on L Improve to resolved  Pulmonary infiltrates on CXR Pulmonary infiltrates resolved d/t pulm infarction     Updated Medication List Outpatient Encounter Prescriptions as of 12/10/2013  Medication Sig  . alendronate (FOSAMAX) 70 MG tablet Take 70 mg by mouth every Thursday. Take with a full glass of water on an empty stomach.  Marland Kitchen. atorvastatin (LIPITOR) 40 MG tablet Take 20 mg by mouth daily at 6 PM.  . polyethylene glycol (MIRALAX / GLYCOLAX) packet Take 17 g by mouth daily.  . rivaroxaban (XARELTO) 20 MG TABS tablet Take 1 tablet (20 mg total) by mouth daily with supper.  . [DISCONTINUED] buPROPion (WELLBUTRIN XL) 150 MG 24 hr tablet Take 450 mg by mouth daily.  . [DISCONTINUED] Calcium Carbonate-Vitamin D 600-200 MG-UNIT CAPS Take by mouth. Take one tablet daily  . [DISCONTINUED] clotrimazole (MYCELEX) 10 MG troche Take 1 tablet (10 mg total) by mouth 5 (five) times daily.  . [DISCONTINUED] cyclobenzaprine (FLEXERIL) 5 MG tablet Take 5  mg by mouth 3 (three) times daily.  . [DISCONTINUED] fluticasone (FLONASE) 50 MCG/ACT nasal spray Place 1 spray into both nostrils daily as needed for allergies or rhinitis.  . [DISCONTINUED] nystatin (MYCOSTATIN) 100000 UNIT/ML suspension Take 4 mLs (400,000 Units total) by mouth 3 (three) times daily. X 5 days  . [DISCONTINUED] ondansetron (ZOFRAN) 8 MG tablet Take 8 mg by mouth 2 (two) times daily as needed for nausea or vomiting.  . [DISCONTINUED] oxyCODONE-acetaminophen (PERCOCET/ROXICET) 5-325 MG per tablet Take 1-2 tablets by mouth every 6 (six) hours as needed for severe pain (pain).  . [DISCONTINUED] oxymetazoline (AFRIN) 0.05 % nasal spray Place 1 spray into both nostrils 2 (two) times daily as needed for congestion.  . [DISCONTINUED] predniSONE (DELTASONE) 10 MG tablet Take 4 tabs PO daily x 4 days, then 3 tabs PO daily x 5 days, then 2 tabs PO daily x 5 days, then 1 tab PO daily x 5 days then, STOP  . [DISCONTINUED]  promethazine-codeine (PHENERGAN WITH CODEINE) 6.25-10 MG/5ML syrup Take 5 mLs by mouth every 6 (six) hours as needed for cough.  . [DISCONTINUED] Rivaroxaban (XARELTO STARTER PACK) 15 & 20 MG TBPK Take as directed: Start with one 15mg  tablet by mouth twice a day with food. On 11/18/13, switch to one 20mg  tablet once a day with food.  . [DISCONTINUED] venlafaxine XR (EFFEXOR-XR) 150 MG 24 hr capsule Take 300 mg by mouth daily.

## 2013-12-11 NOTE — Assessment & Plan Note (Signed)
Hx of submassive PE s/p TPA intracatheter Provoked PE with R leg ankle fx Plan 3 months Rx with xarelto

## 2013-12-11 NOTE — Assessment & Plan Note (Signed)
Pleural effusion parapneumonic on L Improve to resolved

## 2013-12-11 NOTE — Assessment & Plan Note (Signed)
Pulmonary infiltrates resolved d/t pulm infarction

## 2013-12-14 NOTE — Progress Notes (Signed)
Quick Note:  Called, spoke with pt. Informed her of cxr results and recs per Dr. Wright. She verbalized understanding and voiced no further questions or concerns at this time. ______ 

## 2013-12-21 ENCOUNTER — Telehealth: Payer: Self-pay | Admitting: Critical Care Medicine

## 2013-12-21 DIAGNOSIS — J90 Pleural effusion, not elsewhere classified: Secondary | ICD-10-CM

## 2013-12-21 DIAGNOSIS — I2699 Other pulmonary embolism without acute cor pulmonale: Secondary | ICD-10-CM

## 2013-12-21 NOTE — Telephone Encounter (Signed)
Pt called back & states she is having pain across her shoulders.  Would like to know if this is normal w/ her condition, etc.  Antionette FairyHolly D Pryor

## 2013-12-21 NOTE — Telephone Encounter (Signed)
Yes a repeat CXR pa lat is fine  Dx pulmonary infarction, pleural effusions

## 2013-12-21 NOTE — Telephone Encounter (Signed)
Ok for tylenol or motrin 400mg  qid prn

## 2013-12-21 NOTE — Telephone Encounter (Signed)
Spoke with pt - she is aware of below and verbalized understanding.  Pt states she called ortho md d/t LE edema and was advised she may need an x-ray.  Last cxr was 12/10/13 --- pt questioning if she should repeat cxr given scapula pain she is how having.  Dr. Delford FieldWright, pls advise.  Thank you.

## 2013-12-21 NOTE — Telephone Encounter (Signed)
Called and spoke to pt. Pt c/o a nagging intermittent ache in between BIL scapula x 2 weeks . Pt worried what she can take now that she is on the Xarelto. Advised pt that she could take tylenol for discomfort and avoid ASA. Pt has CKD stage III, advised against NSAIDS. Pt still requesting recs by PW for what she should take. Pt also requesting a sleep aid. Advised pt she needs to contact PCP for sleep aid. Pt verbalized understanding.  Dr. Delford FieldWright please advise what other medication pt can take OTC to help with an ache in her back.

## 2013-12-22 ENCOUNTER — Ambulatory Visit (HOSPITAL_BASED_OUTPATIENT_CLINIC_OR_DEPARTMENT_OTHER)
Admission: RE | Admit: 2013-12-22 | Discharge: 2013-12-22 | Disposition: A | Discharge status: Home / Self Care | Payer: Medicare Other | Source: Ambulatory Visit | Attending: Critical Care Medicine | Admitting: Critical Care Medicine

## 2013-12-22 DIAGNOSIS — J90 Pleural effusion, not elsewhere classified: Secondary | ICD-10-CM

## 2013-12-22 DIAGNOSIS — I2699 Other pulmonary embolism without acute cor pulmonale: Secondary | ICD-10-CM | POA: Diagnosis not present

## 2013-12-22 NOTE — Progress Notes (Signed)
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 Continue current meds as prescribed at last office visit ______

## 2013-12-22 NOTE — Telephone Encounter (Signed)
Called and spoke to pt. Informed pt of the recs per PW. Pt verbalized understanding and denied any further questions or concerns at this time. Pt requesting CXR be done at [facility]. Order placed for [facility]. Nothing further needed.

## 2013-12-23 NOTE — Progress Notes (Signed)
Quick Note:  Called, spoke with pt. Informed her of cxr results and recs per Dr. Wright. She verbalized understanding and voiced no further questions or concerns at this time. ______ 

## 2014-02-18 ENCOUNTER — Other Ambulatory Visit: Payer: Self-pay | Admitting: Critical Care Medicine

## 2014-02-18 ENCOUNTER — Other Ambulatory Visit (HOSPITAL_COMMUNITY): Payer: Self-pay | Admitting: Cardiology

## 2014-02-18 ENCOUNTER — Encounter: Payer: Self-pay | Admitting: Critical Care Medicine

## 2014-02-18 ENCOUNTER — Ambulatory Visit (INDEPENDENT_AMBULATORY_CARE_PROVIDER_SITE_OTHER): Payer: Medicare Other | Admitting: Critical Care Medicine

## 2014-02-18 VITALS — BP 108/72 | HR 74 | Temp 98.1°F | Ht 64.0 in | Wt 158.0 lb

## 2014-02-18 DIAGNOSIS — I8290 Acute embolism and thrombosis of unspecified vein: Secondary | ICD-10-CM

## 2014-02-18 DIAGNOSIS — Z86711 Personal history of pulmonary embolism: Secondary | ICD-10-CM

## 2014-02-18 NOTE — Assessment & Plan Note (Signed)
Hx of massive PE s/p TPA provoked in female.  No other risk factors. Now better Plan 4 months Treatment with xarelto ends January chck hypercoag panel Stay on Xarelto until current supply runs out then stop Return February for follow up A venous doppler of your Right leg will be obtained

## 2014-02-18 NOTE — Progress Notes (Signed)
Subjective:    Patient ID: Christy ResidesLinda Short, female    DOB: 10/09/1945, 68 y.o.   MRN: [id number]  HPI 68 yo female seen for initial  PCCM consult for admission 10/23/2013 for PE and RLE DVT  after complex R ankle fx .   11/10/13 Post Hospital follow up  Pt was admitted 8/21-8/31 for acute PE w/ acute hypoxic RF . Admitted with acute dyspnea . Found to have a submassive PE ,w/ evidence of Right heart strain. She underwent cath directed thrombolytics . She was started on Xarelto on 8/25.  CT showed bilateral infiltrates and was started on IV abx. Developed a subsequent Left pleural exudative effusion requiring thoracentesis . She was started on steroid challenge for possible ALI . Did require BIPAP intermittently  w/ transition to [place] O2.  .  Since discharge she is improving but still weak and low energy.  Taking Xarelto 15mg  Twice daily  , has 20mg  daily to begin on 9/16. We sent additional refills to pharm for the 20mg  daily dose and explained to pt they will need to pick this up to continue therapy for this .  CXR today shows resolution of pleural effusions , evolving pulmonary infarcts /pulmonary parenchymal scarring.  Denies chest pain, hemoptysis, edema or n/v/d , no bleeding.   12/10/2013 Chief Complaint  Patient presents with  . Follow-up    Pt c/o nonprod cough, no other complaints at this time.   [facility]  Doing well   Dyspnea is better.  Min dry cough.  No other issues. No prior blood clots. Pt had ankle fx in three places July 10, then developed clots in august  Sequence of events after [facility] 10/[facility] as below (taken from progress notes and verified) 8/18 CT ([facility]): PE present, other details not available  8/19 Lower Extremity Doppler (+) DVT  8/18 Echocardiogram: EF of > 70%, a moderately dilated RV, and moderately reduced RV function.    ------------------------------------------------------------------------------------------------------------------------------------  THIS ADMISSION  8/21 [facility] with recent dx of PE after R ankle fx and worsening hypoxemia.  8/21 CTA chest: Large volume embolus including small saddle PE, evidence of RH strain, RV/LV ratio 1.1  8/22 catheter directed TPA lysis of PE  8/23 Echocardiogram: Left sdie normal. RV mildly dilated, RV syst function mildly reduced  8/25 Moderate to large L pleural effusion on CXR. Still requiring NRB mask  8/25 L thoracentesis: LDH 367, protein 2.7, WBC 1996, 72% neutrophils  8/25 changed from heparin to rivaroxaban  8/25 increasing bilateral pulm infiltrates - empiric steroids and abx ordered. Procalcitonin protocol initiated  8/26 Slightly less dyspneic but still requires NRB and intermittent NPPV. PCT 0.66, 0.57  8/27 Less dyspneic. Able to wean from NRB to VM. CXR shows improvement in infiltrates  8/28 Markedly improved CXR. Able to maintain sats on  O2. Transfer to med-surg   02/18/2014 Chief Complaint  Patient presents with  . Follow-up    f/u embolism; slight cough; no complaints  No real dyspnea.  No bleeding issues on Xarelto.  No chest pain.  Notes ankle swelling only at fracture site   Review of Systems Constitutional:   No  weight loss, night sweats,  Fevers, chills,  +fatigue, or  lassitude.  HEENT:   No headaches,  Difficulty swallowing,  Tooth/dental problems, or  Sore throat,                No  sneezing, itching, ear ache, nasal congestion, post nasal drip,   CV:  No chest pain,  Orthopnea, PND, swelling in lower extremities, anasarca, dizziness, palpitations, syncope.   GI  No heartburn, indigestion, abdominal pain, nausea, vomiting, diarrhea, change in bowel habits, loss of appetite, bloody stools.   Resp:   No non-productive cough,  No coughing up of blood.  No change in color of mucus.  No wheezing.  No chest  wall deformity  Skin: no rash or lesions.  GU: no dysuria, change in color of urine, no urgency or frequency.  No flank pain, no hematuria   MS:  No joint pain or swelling.  No decreased range of motion.  No back pain.  Psych:  No change in mood or affect. No depression or anxiety.  No memory loss.     Objective:   Physical Exam Filed Vitals:   02/18/14 1147  BP: 108/72  Pulse: 74  Temp: 98.1 F (36.7 C)  TempSrc: Oral  Height: 5\' 4"  (1.626 m)  Weight: 158 lb (71.668 kg)  SpO2: 100%    Gen: Pleasant, well-nourished, in no distress,  normal affect  ENT: No lesions,  mouth clear,  oropharynx clear, no postnasal drip  Neck: No JVD, no TMG, no carotid bruits  Lungs: No use of accessory muscles, no dullness to percussion, clear without rales or rhonchi  Cardiovascular: RRR, heart sounds normal, no murmur or gallops, no peripheral edema  Abdomen: soft and NT, no HSM,  BS normal  Musculoskeletal: No deformities, no cyanosis or clubbing  Neuro: alert, non focal  Skin: Warm, no lesions or rashes  No results found.     Assessment & Plan:   Personal history of pulmonary embolism Hx of massive PE s/p TPA provoked in female.  No other risk factors. Now better Plan 4 months Treatment with xarelto ends January chck hypercoag panel Stay on Xarelto until current supply runs out then stop Return February for follow up A venous doppler of your Right leg will be obtained     Updated Medication List Outpatient Encounter Prescriptions as of 02/18/2014  Medication Sig  . alendronate (FOSAMAX) 70 MG tablet Take 70 mg by mouth every Thursday. Take with a full glass of water on an empty stomach.  Marland Kitchen. atorvastatin (LIPITOR) 40 MG tablet Take 20 mg by mouth daily at 6 PM.  . polyethylene glycol (MIRALAX / GLYCOLAX) packet Take 17 g by mouth daily.  . rivaroxaban (XARELTO) 20 MG TABS tablet Take 1 tablet (20 mg total) by mouth daily with supper.

## 2014-02-18 NOTE — Patient Instructions (Signed)
Blood clotting studies sent today Stay on Xarelto until current supply runs out then stop Return February for follow up A venous doppler of your Right leg will be obtained

## 2014-02-19 LAB — PROTEIN S ACTIVITY: Protein S Activity: 96 % (ref 69–129)

## 2014-02-19 LAB — LUPUS ANTICOAGULANT PANEL
DRVVT: 39 s (ref <42.9)
LUPUS ANTICOAGULANT: NOT DETECTED
PTT Lupus Anticoagulant: 43.2 secs — ABNORMAL HIGH (ref 28.0–43.0)
PTTLA 4:1 Mix: 45.9 secs — ABNORMAL HIGH (ref 28.0–43.0)
PTTLA Confirmation: 0 secs (ref <8.0)

## 2014-02-19 LAB — CARDIOLIPIN ANTIBODIES, IGG, IGM, IGA
Anticardiolipin IgA: 9 APL U/mL (ref <22)
Anticardiolipin IgG: 4 GPL U/mL (ref <23)
Anticardiolipin IgM: 1 MPL U/mL (ref <11)

## 2014-02-19 LAB — D-DIMER, QUANTITATIVE: D-Dimer, Quant: 0.35 ug/mL-FEU (ref 0.00–0.48)

## 2014-02-19 LAB — PROTEIN C ACTIVITY: PROTEIN C ACTIVITY: 197 % — AB (ref 75–133)

## 2014-02-22 ENCOUNTER — Ambulatory Visit (HOSPITAL_COMMUNITY): Payer: Medicare Other | Attending: Cardiovascular Disease | Admitting: Cardiology

## 2014-02-22 ENCOUNTER — Encounter: Payer: Self-pay | Admitting: Critical Care Medicine

## 2014-02-22 DIAGNOSIS — I82401 Acute embolism and thrombosis of unspecified deep veins of right lower extremity: Secondary | ICD-10-CM | POA: Diagnosis present

## 2014-02-22 DIAGNOSIS — Z7901 Long term (current) use of anticoagulants: Secondary | ICD-10-CM | POA: Diagnosis not present

## 2014-02-22 DIAGNOSIS — Z8249 Family history of ischemic heart disease and other diseases of the circulatory system: Secondary | ICD-10-CM | POA: Diagnosis not present

## 2014-02-22 DIAGNOSIS — Z86711 Personal history of pulmonary embolism: Secondary | ICD-10-CM | POA: Diagnosis present

## 2014-02-22 DIAGNOSIS — Z09 Encounter for follow-up examination after completed treatment for conditions other than malignant neoplasm: Secondary | ICD-10-CM | POA: Diagnosis not present

## 2014-02-22 DIAGNOSIS — Z86718 Personal history of other venous thrombosis and embolism: Secondary | ICD-10-CM | POA: Diagnosis not present

## 2014-02-22 DIAGNOSIS — I8290 Acute embolism and thrombosis of unspecified vein: Secondary | ICD-10-CM

## 2014-02-22 DIAGNOSIS — M79661 Pain in right lower leg: Secondary | ICD-10-CM

## 2014-02-22 LAB — PROTHROMBIN GENE MUTATION

## 2014-02-22 LAB — FACTOR 5 LEIDEN

## 2014-02-22 LAB — PROTEIN C, TOTAL: Protein C, Total: 87 % (ref 72–160)

## 2014-02-22 LAB — PROTEIN S, TOTAL: PROTEIN S TOTAL: 81 % (ref 60–150)

## 2014-02-22 NOTE — Progress Notes (Signed)
Right lower extremity venous duplex performed  

## 2014-02-22 NOTE — Progress Notes (Signed)
Quick Note:  Called, spoke with pt. Discussed lab results and recs per Dr. Wright. She verbalized understanding and voiced no further questions or concerns at this time. ______ 

## 2014-04-22 ENCOUNTER — Ambulatory Visit (INDEPENDENT_AMBULATORY_CARE_PROVIDER_SITE_OTHER): Payer: Medicare Other | Admitting: Critical Care Medicine

## 2014-04-22 ENCOUNTER — Encounter: Payer: Self-pay | Admitting: Critical Care Medicine

## 2014-04-22 VITALS — BP 116/69 | HR 90 | Temp 98.1°F | Ht 64.0 in | Wt 161.0 lb

## 2014-04-22 DIAGNOSIS — I8290 Acute embolism and thrombosis of unspecified vein: Secondary | ICD-10-CM

## 2014-04-22 DIAGNOSIS — Z86711 Personal history of pulmonary embolism: Secondary | ICD-10-CM

## 2014-04-22 NOTE — Patient Instructions (Signed)
D dimer will be obtained No other medication changes Return 6 months

## 2014-04-22 NOTE — Progress Notes (Signed)
Subjective:    Patient ID: Christy Short, female    DOB: [birth year], 69 y.o.   MRN: [id number]  HPI 69 yo female seen for initial  PCCM consult for admission 10/23/2013 for PE and RLE DVT  after complex R ankle fx .   11/10/13 Post Hospital follow up  Pt was admitted 8/21-8/31 for acute PE w/ acute hypoxic RF . Admitted with acute dyspnea . Found to have a submassive PE ,w/ evidence of Right heart strain. She underwent cath directed thrombolytics . She was started on Xarelto on 8/25.  CT showed bilateral infiltrates and was started on IV abx. Developed a subsequent Left pleural exudative effusion requiring thoracentesis . She was started on steroid challenge for possible ALI . Did require BIPAP intermittently  w/ transition to [place] O2.  .  Since discharge she is improving but still weak and low energy.  Taking Xarelto  Twice daily  , has  daily to begin on 9/16. We sent additional refills to pharm for the  daily dose and explained to pt they will need to pick this up to continue therapy for this .  CXR today shows resolution of pleural effusions , evolving pulmonary infarcts /pulmonary parenchymal scarring.  Denies chest pain, hemoptysis, edema or n/v/d , no bleeding.   12/10/2013 Chief Complaint  Patient presents with  . Follow-up    Pt c/o nonprod cough, no other complaints at this time.   Pt in [facility] and then to [facility]  Doing well   Dyspnea is better.  Min dry cough.  No other issues. No prior blood clots. Pt had ankle fx in three places July 10, then developed clots in august  Sequence of events after tfr to [facility] 10/2013 as below (taken from progress notes and verified) 8/18 CT ([facility]): PE present, other details not available  8/19 Lower Extremity Doppler (+) DVT  8/18 Echocardiogram: EF of > 70%, a moderately dilated RV, and moderately reduced RV function.    ------------------------------------------------------------------------------------------------------------------------------------  THIS ADMISSION  8/21 Admitted in transfer from [facility] with recent dx of PE after R ankle fx and worsening hypoxemia.  8/21 CTA chest: Large volume embolus including small saddle PE, evidence of RH strain, RV/LV ratio 1.1  8/22 catheter directed TPA lysis of PE  8/23 Echocardiogram: Left sdie normal. RV mildly dilated, RV syst function mildly reduced  8/25 Moderate to large L pleural effusion on CXR. Still requiring NRB mask  8/25 L thoracentesis: LDH 367, protein 2.7, WBC 1996, 72% neutrophils  8/25 changed from heparin to rivaroxaban  8/25 increasing bilateral pulm infiltrates - empiric steroids and abx ordered. Procalcitonin protocol initiated  8/26 Slightly less dyspneic but still requires NRB and intermittent NPPV. PCT 0.66, 0.57  8/27 Less dyspneic. Able to wean from NRB to VM. CXR shows improvement in infiltrates  8/28 Markedly improved CXR. Able to maintain sats on  O2. Transfer to med-surg   02/18/2014 Chief Complaint  Patient presents with  . Follow-up    f/u embolism; slight cough; no complaints  No real dyspnea.  No bleeding issues on Xarelto.  No chest pain.  Notes ankle swelling only at fracture site  04/22/2014 Chief Complaint  Patient presents with  . Follow-up    breathing doing well.  some cough, no tightness in chest.  no concerns.   No chest pain or dyspnea.  Overall doing well.  Off xarelto since 03/2014.  All hypercoag w/u was normal Pt denies any significant sore  throat, nasal congestion or excess secretions, fever, chills, sweats, unintended weight loss, pleurtic or exertional chest pain, orthopnea PND, or leg swelling Pt denies any increase in rescue therapy over baseline, denies waking up needing it or having any early am or nocturnal exacerbations of coughing/wheezing/or dyspnea. Pt also denies any obvious fluctuation in symptoms  with  weather or environmental change or other alleviating or aggravating factors   Review of Systems Constitutional:   No  weight loss, night sweats,  Fevers, chills,  +fatigue, or  lassitude.  HEENT:   No headaches,  Difficulty swallowing,  Tooth/dental problems, or  Sore throat,                No sneezing, itching, ear ache, nasal congestion, post nasal drip,   CV:  No chest pain,  Orthopnea, PND, swelling in lower extremities, anasarca, dizziness, palpitations, syncope.   GI  No heartburn, indigestion, abdominal pain, nausea, vomiting, diarrhea, change in bowel habits, loss of appetite, bloody stools.   Resp:   No non-productive cough,  No coughing up of blood.  No change in color of mucus.  No wheezing.  No chest wall deformity  Skin: no rash or lesions.  GU: no dysuria, change in color of urine, no urgency or frequency.  No flank pain, no hematuria   MS:  No joint pain or swelling.  No decreased range of motion.  No back pain.  Psych:  No change in mood or affect. No depression or anxiety.  No memory loss.     Objective:   Physical Exam Filed Vitals:   04/22/14 1206 04/22/14 1208  BP:  116/69  Pulse:  90  Temp:  98.1 F (36.7 C)  TempSrc:  Oral  Height: 5\' 4"  (1.626 m)   Weight: 161 lb (73.029 kg)   SpO2:  98%    Gen: Pleasant, well-nourished, in no distress,  normal affect  ENT: No lesions,  mouth clear,  oropharynx clear, no postnasal drip  Neck: No JVD, no TMG, no carotid bruits  Lungs: No use of accessory muscles, no dullness to percussion, clear without rales or rhonchi  Cardiovascular: RRR, heart sounds normal, no murmur or gallops, no peripheral edema  Abdomen: soft and NT, no HSM,  BS normal  Musculoskeletal: No deformities, no cyanosis or clubbing  Neuro: alert, non focal  Skin: Warm, no lesions or rashes  No results found.     Assessment & Plan:   History of pulmonary embolism Hx of VTE/ PE submassive resolved. Now off anticoagulation.  Provoked PE VTE.  No hypercoag factors. Normal d Dimer off anticoags Plan Follow off xarelto Return 6 months  Pt very grateful to Drs Ruthe MannanMcCollough and Vassie LollAlva      Updated Medication List Outpatient Encounter Prescriptions as of 04/22/2014  Medication Sig  . alendronate (FOSAMAX) 70 MG tablet Take 70 mg by mouth every Thursday. Take with a full glass of water on an empty stomach.  Marland Kitchen. atorvastatin (LIPITOR) 40 MG tablet Take 20 mg by mouth daily at 6 PM.  . polyethylene glycol (MIRALAX / GLYCOLAX) packet Take 17 g by mouth daily.  . [DISCONTINUED] rivaroxaban (XARELTO) 20 MG TABS tablet Take 1 tablet (20 mg total) by mouth daily with supper. (Patient not taking: Reported on 04/22/2014)

## 2014-04-23 LAB — D-DIMER, QUANTITATIVE (NOT AT ARMC): D DIMER QUANT: 0.33 ug{FEU}/mL (ref 0.00–0.48)

## 2014-04-23 NOTE — Assessment & Plan Note (Signed)
Hx of VTE/ PE submassive resolved. Now off anticoagulation. Provoked PE VTE.  No hypercoag factors. Normal d Dimer off anticoags Plan Follow off xarelto Return 6 months  Pt very grateful to Drs Ruthe MannanMcCollough and Vassie LollAlva

## 2014-04-23 NOTE — Progress Notes (Signed)
Quick Note:  Spoke with pt. Discussed lab results and recs per Dr.Wright. She verbalized understanding and voiced no further questions or concerns at this time. ______ 

## 2014-11-04 ENCOUNTER — Ambulatory Visit (INDEPENDENT_AMBULATORY_CARE_PROVIDER_SITE_OTHER): Payer: Medicare Other | Admitting: Pulmonary Disease

## 2014-11-04 ENCOUNTER — Encounter: Payer: Self-pay | Admitting: Pulmonary Disease

## 2014-11-04 VITALS — BP 101/57 | HR 68 | Temp 98.2°F | Ht 64.0 in | Wt 169.0 lb

## 2014-11-04 DIAGNOSIS — I270 Primary pulmonary hypertension: Secondary | ICD-10-CM | POA: Diagnosis not present

## 2014-11-04 DIAGNOSIS — Z86711 Personal history of pulmonary embolism: Secondary | ICD-10-CM | POA: Diagnosis not present

## 2014-11-04 DIAGNOSIS — I272 Pulmonary hypertension, unspecified: Secondary | ICD-10-CM

## 2014-11-04 NOTE — Assessment & Plan Note (Signed)
She has recovered well Check echocardiogram for right heart pressures-to make sure no residual pulmonary hypertension Otherwise routine precautions for future surgery to prevent recurrence

## 2014-11-04 NOTE — Patient Instructions (Signed)
You have recovered well Check echocardiogram for pressures

## 2014-11-04 NOTE — Progress Notes (Signed)
   Subjective:    Patient ID: Christy Short, female    DOB: 1945-07-05, 69 y.o.   MRN: [id number]  HPI  69 yo female seen for initial PCCM consult for admission 10/23/2013 for PE and RLE DVT after complex R ankle fx .   Admitted with acute dyspnea . Found to have a submassive PE ,w/ evidence of Right heart strain. She underwent cath directed thrombolytics . Developed a subsequent Left pleural exudative effusion requiring thoracentesis .   Off xarelto since 03/2014  Chief Complaint  Patient presents with  . Follow-up    Breathing doing well. no concerns.   She denies dyspnea cough or wheezing. She has recovered well. Is able to ambulate long distances. Right leg remained swollen but follow-up duplex has been negative  10/2013  Echocardiogram: EF of > 70%, a moderately dilated RV, and moderately reduced RV function Duplex 02/2014 - resolved DVT  Past Medical History  Diagnosis Date  . Pulmonary embolism 10/2013  . Hyperparathyroidism   . HTN (hypertension)   . Hyperlipidemia   . Depression      Review of Systems neg for any significant sore throat, dysphagia, itching, sneezing, nasal congestion or excess/ purulent secretions, fever, chills, sweats, unintended wt loss, pleuritic or exertional cp, hempoptysis, orthopnea pnd or change in chronic leg swelling. Also denies presyncope, palpitations, heartburn, abdominal pain, nausea, vomiting, diarrhea or change in bowel or urinary habits, dysuria,hematuria, rash, arthralgias, visual complaints, headache, numbness weakness or ataxia.     Objective:   Physical Exam  Gen. Pleasant, well-nourished, in no distress ENT - no lesions, no post nasal drip Neck: No JVD, no thyromegaly, no carotid bruits Lungs: no use of accessory muscles, no dullness to percussion, clear without rales or rhonchi  Cardiovascular: Rhythm regular, heart sounds  normal, no murmurs or gallops, no peripheral edema Musculoskeletal: No deformities, no cyanosis or  clubbing         Assessment & Plan:

## 2014-11-10 ENCOUNTER — Ambulatory Visit (HOSPITAL_BASED_OUTPATIENT_CLINIC_OR_DEPARTMENT_OTHER)
Admission: RE | Admit: 2014-11-10 | Discharge: 2014-11-10 | Disposition: A | Discharge status: Home / Self Care | Payer: Medicare Other | Source: Ambulatory Visit | Attending: Pulmonary Disease | Admitting: Pulmonary Disease

## 2014-11-10 DIAGNOSIS — I313 Pericardial effusion (noninflammatory): Secondary | ICD-10-CM | POA: Diagnosis not present

## 2014-11-10 DIAGNOSIS — I10 Essential (primary) hypertension: Secondary | ICD-10-CM | POA: Diagnosis not present

## 2014-11-10 DIAGNOSIS — I2699 Other pulmonary embolism without acute cor pulmonale: Secondary | ICD-10-CM | POA: Insufficient documentation

## 2014-11-10 DIAGNOSIS — I270 Primary pulmonary hypertension: Secondary | ICD-10-CM | POA: Diagnosis not present

## 2014-11-10 DIAGNOSIS — I272 Pulmonary hypertension, unspecified: Secondary | ICD-10-CM

## 2014-11-10 NOTE — Progress Notes (Signed)
  Echocardiogram 2D Echocardiogram has been performed.  Christy Short 11/10/2014, 11:53 AM

## 2015-06-29 ENCOUNTER — Ambulatory Visit: Payer: Self-pay | Admitting: Internal Medicine

## 2015-07-26 ENCOUNTER — Ambulatory Visit: Payer: Self-pay | Admitting: Pediatrics

## 2016-06-19 IMAGING — CR DG CHEST 1V PORT
1 series · 1 of 1 positions shown · non-contrast
Comparison: One-view chest 10/27/2013

CLINICAL DATA: Respiratory failure.

EXAM:
PORTABLE CHEST - 1 VIEW

[AP]
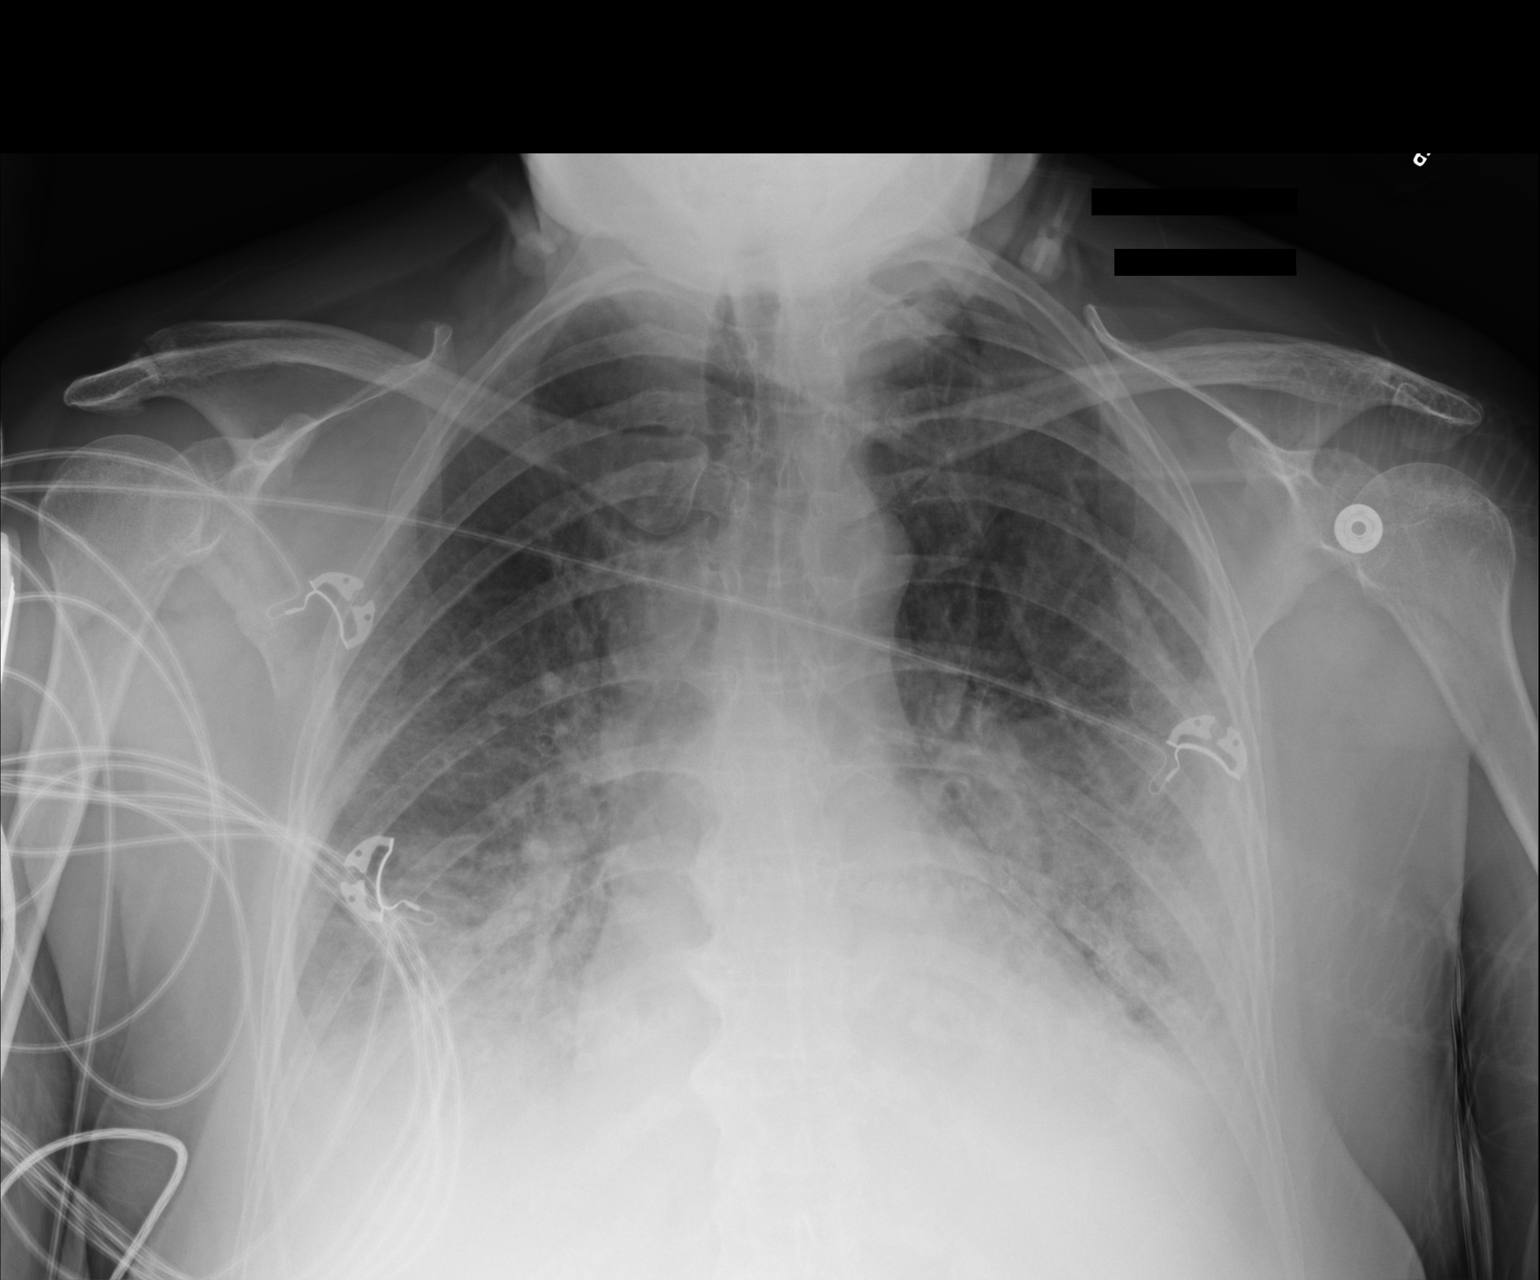

[1 of 1 positions shown; findings below may reference images not displayed]

FINDINGS: The heart size is normal. Aeration in the upper lungs is improved.
Bilateral pleural effusions and lower lobe airspace disease
persists.
IMPRESSION: 1. Improved aeration of the upper lungs bilaterally.
2. Persistent bilateral pleural effusions and basilar airspace
disease compatible congestive heart failure or ARDS.

## 2016-07-01 IMAGING — CR DG CHEST 2V
2 series · 2 of 2 positions shown · non-contrast
Comparison: 10/23/2013.  11/01/2013.

CLINICAL DATA: Followup from pulmonary embolism. Followup pleural
effusion.

EXAM:
CHEST  2 VIEW

[view not recorded (1 of 2)]
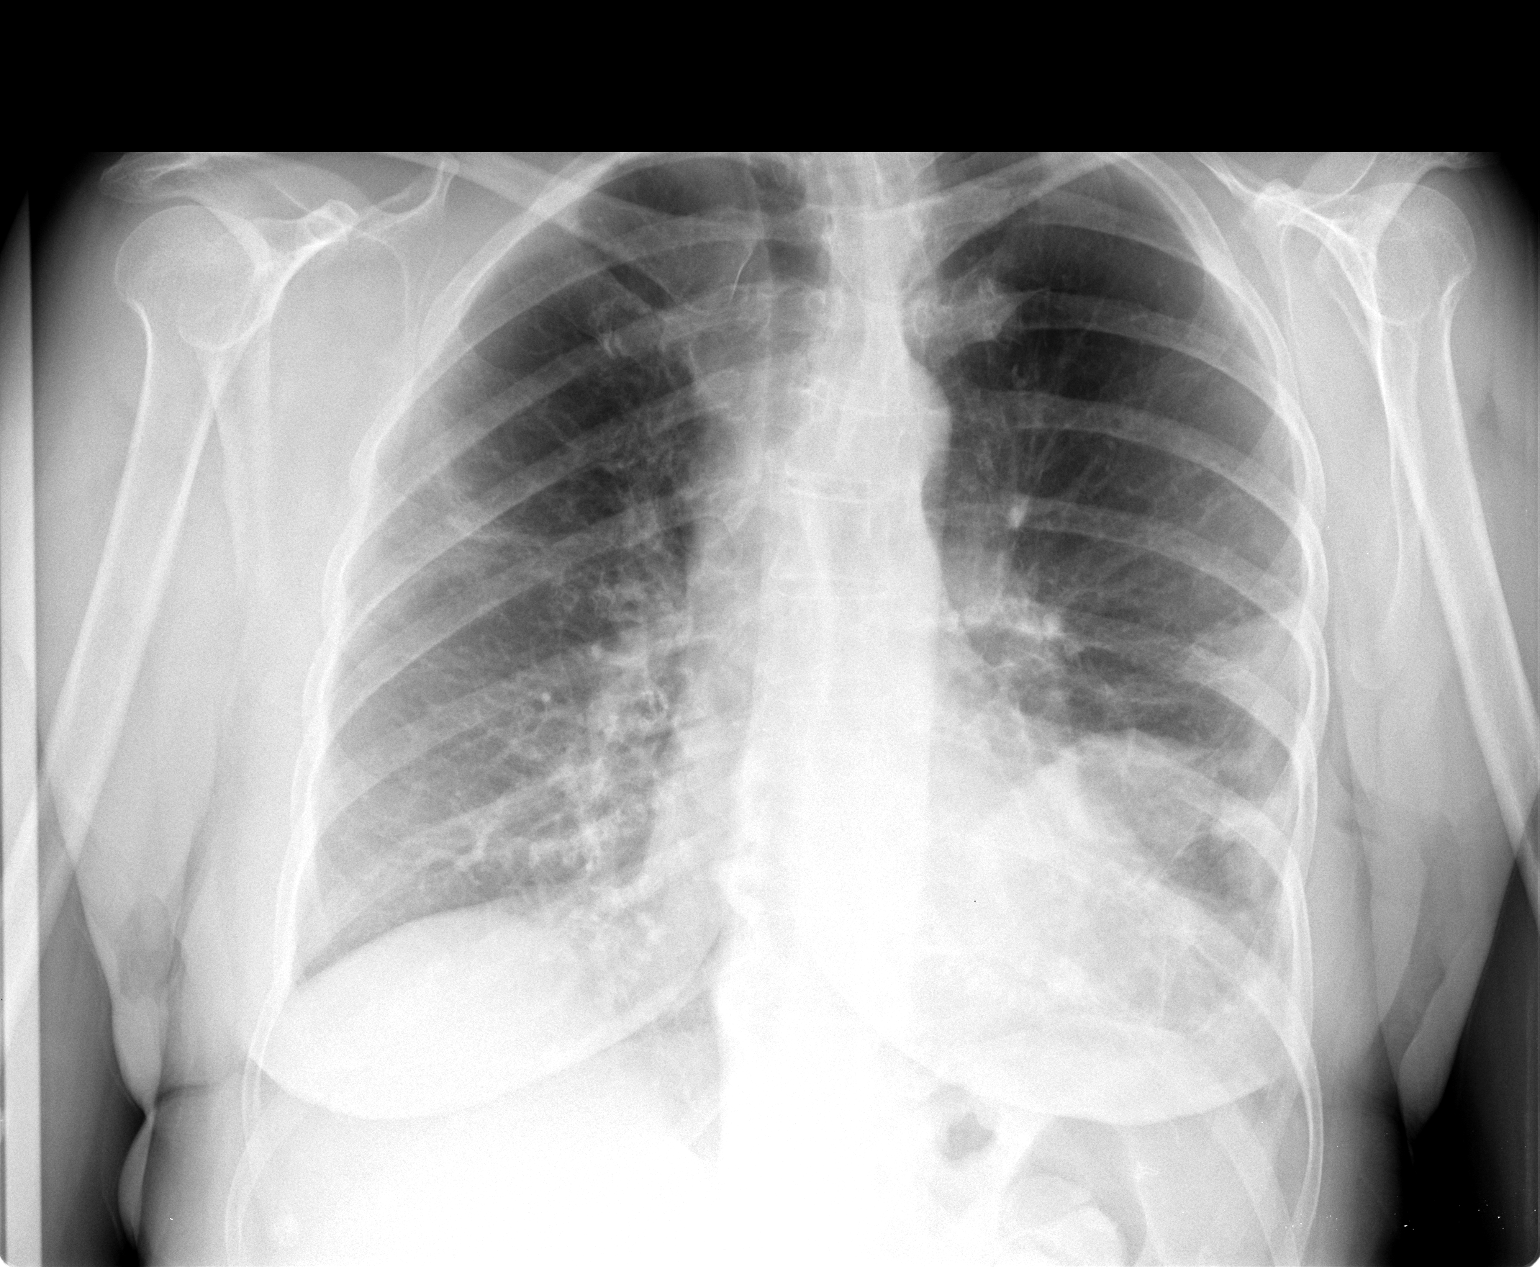

[view not recorded (2 of 2)]
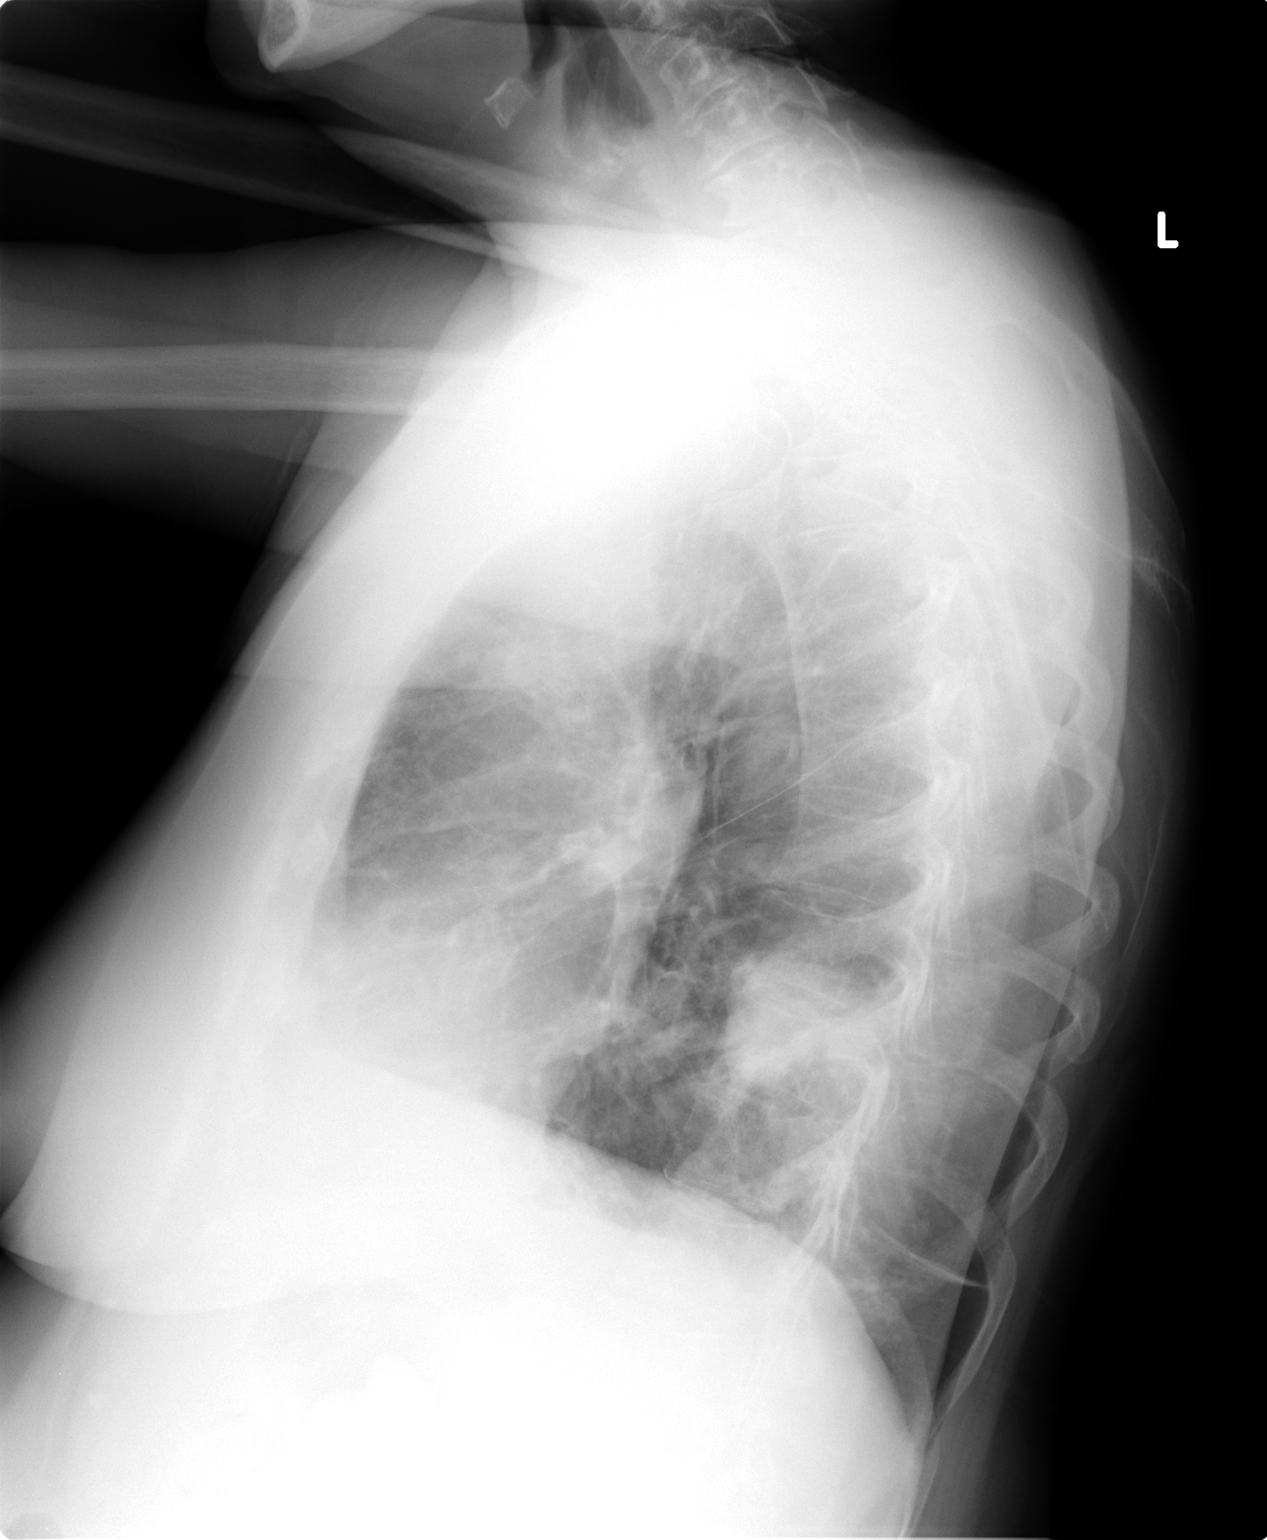

[2 of 2 positions shown; findings below may reference images not displayed]

FINDINGS: Cardiopericardial silhouette appears within normal limits. There is
no interval change in the lungs with triangular peripheral opacity
compatible with pulmonary infarction in the LEFT mid lung. LEFT
basilar atelectasis and probable evolving infarction. Similar
changes are present in the peripheral RIGHT upper lobe. The pleural
effusions appear to have resolved compared to the prior examination.
Patient is mildly rotated to the RIGHT.
IMPRESSION: 1. No acute cardiopulmonary disease.
2. Evolving pulmonary infarcts/pulmonary parenchymal scarring.
3. Resolution of bilateral pleural effusions associated with
pulmonary embolism.

## 2016-07-31 IMAGING — CR DG CHEST 2V
2 series · 2 of 2 positions shown · non-contrast
Comparison: November 10, 2013

CLINICAL DATA: Recent pulmonary emboli.  Hypertension

EXAM:
CHEST  2 VIEW

[w chest pa]
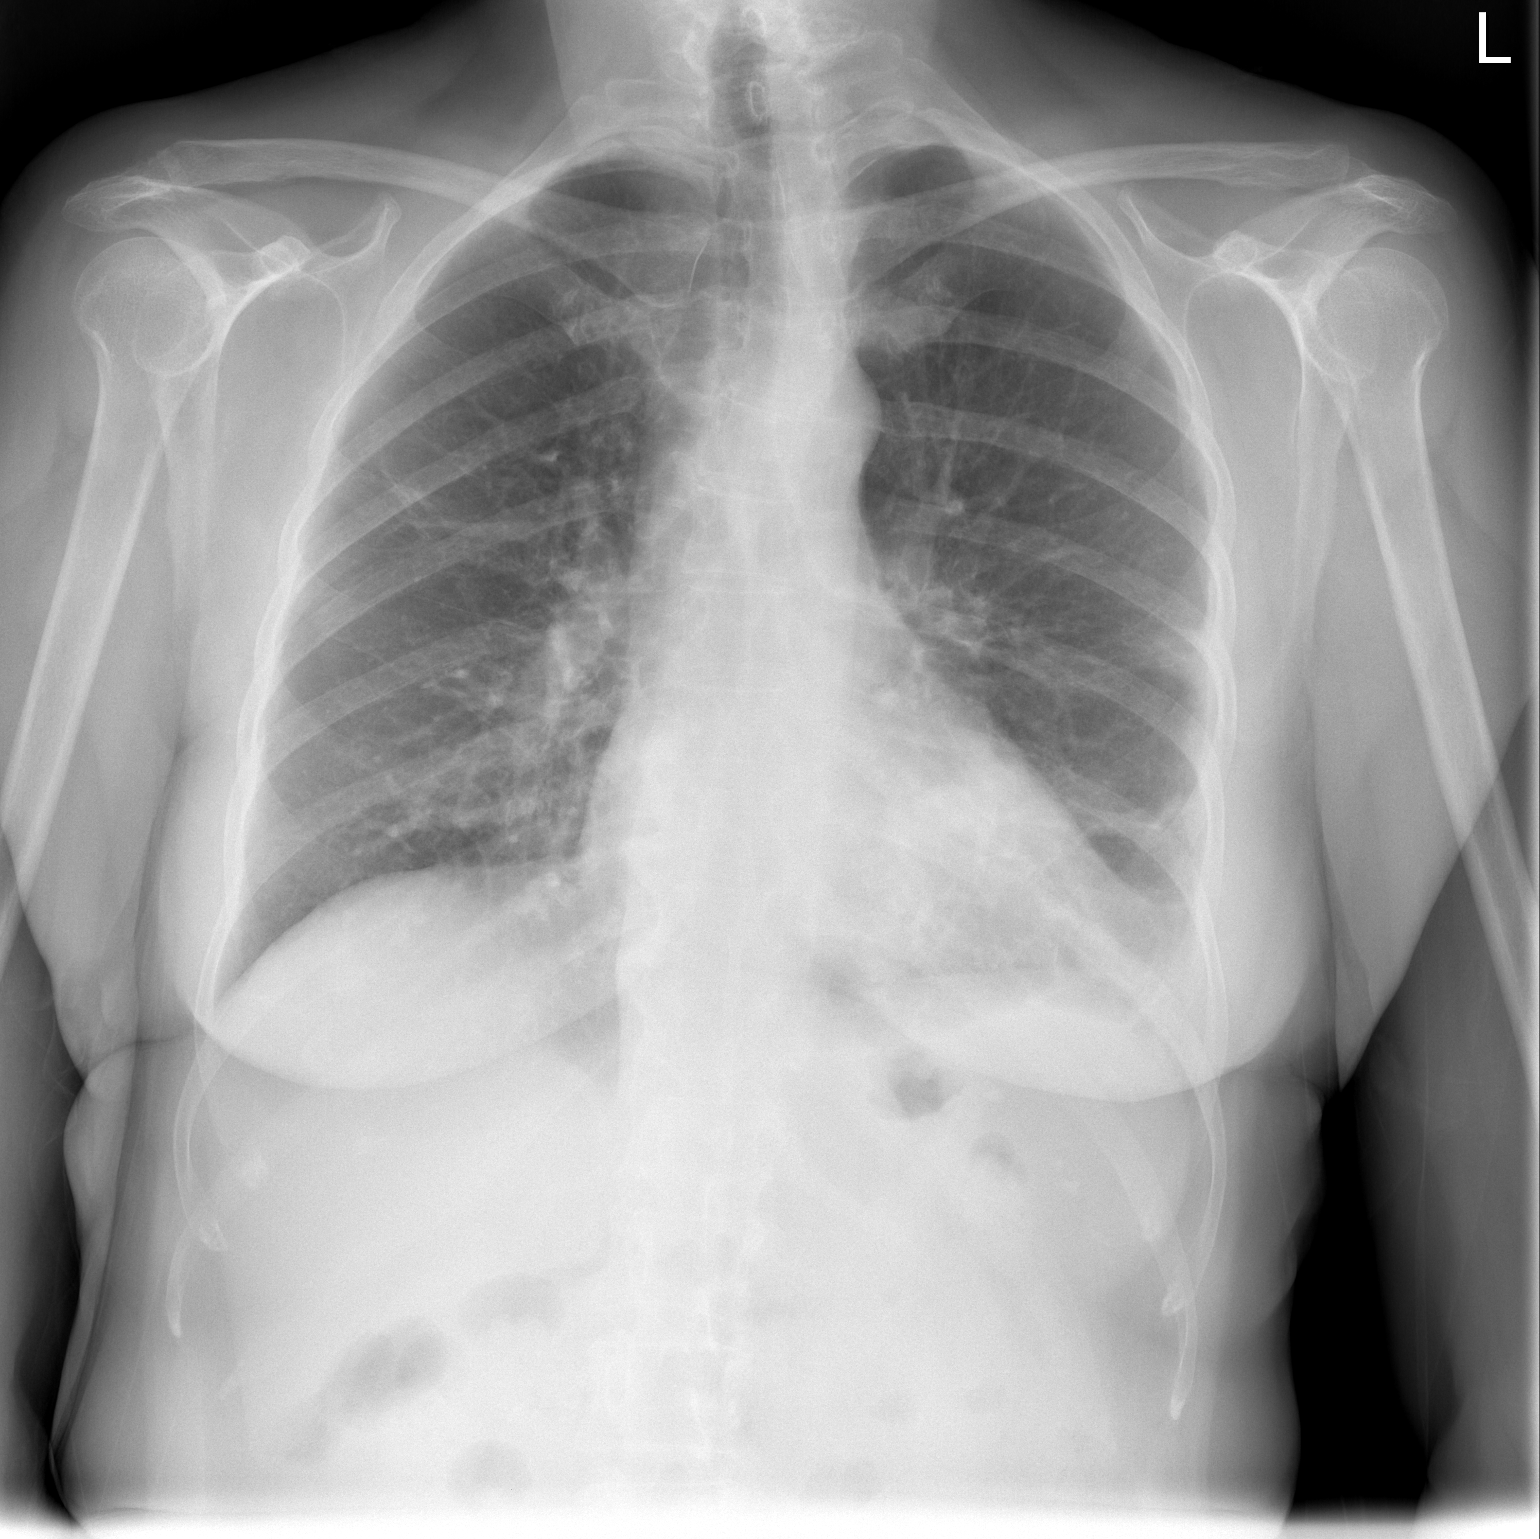

[w chest lat]
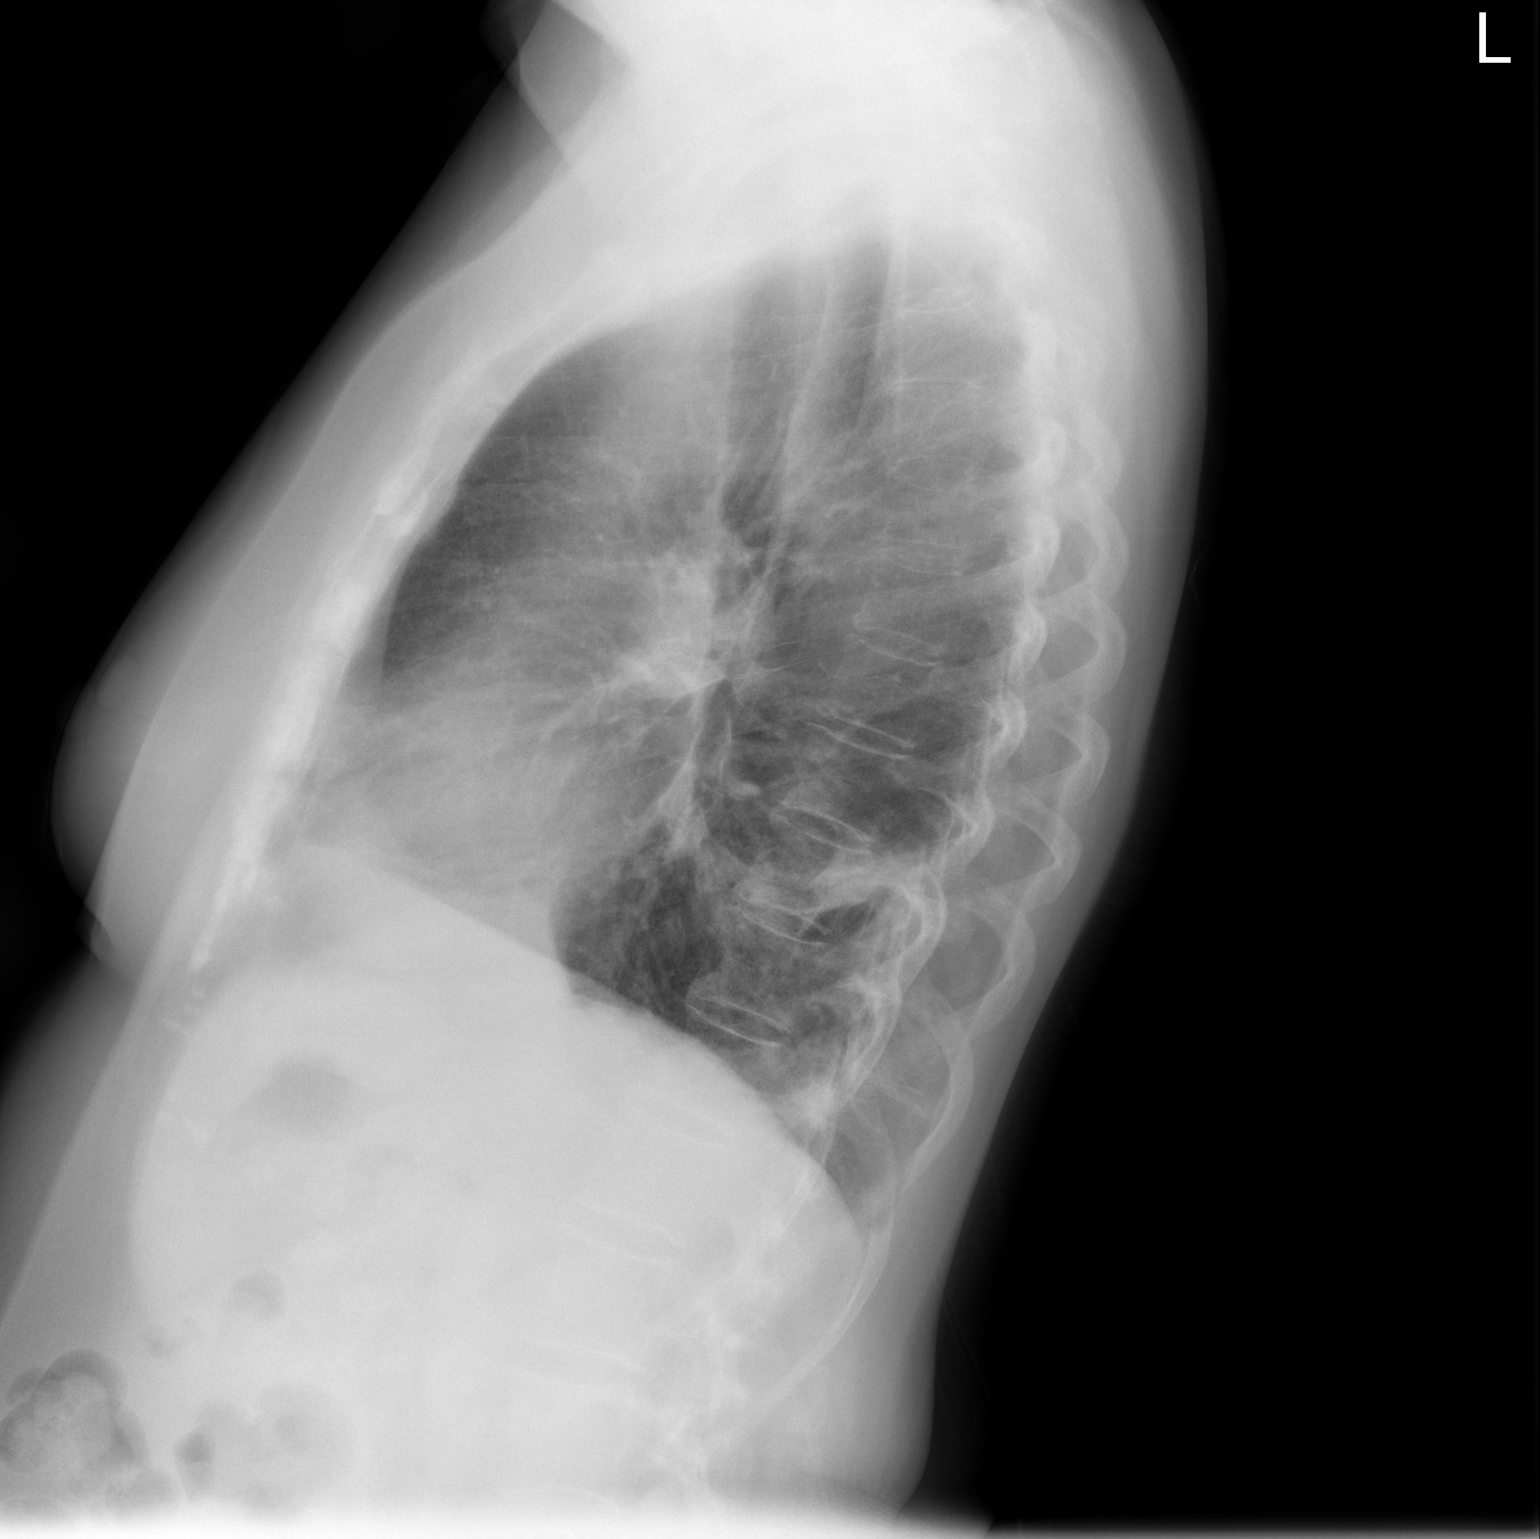

[2 of 2 positions shown; findings below may reference images not displayed]

FINDINGS: There has been significant clearing of opacities from the left base
which appear consistent with a combination of infiltrate and
pulmonary infarcts. Patchy opacity remains in the left mid and lower
lung zone, most likely representing residua of infiltrate and
infarcts. There is mild scarring in the right upper lobe which is
stable. Right lung is otherwise clear. Heart is upper normal in size
with pulmonary vascularity within normal limits. No pneumothorax. No
adenopathy. No bone lesions.
IMPRESSION: Significant partial clearing opacity from the left mid and lower
lung zones, consistent with resolving infiltrate infarcts. Stable
mild scarring right upper lobe. No new opacity. No change in cardiac
silhouette.

## 2017-02-12 ENCOUNTER — Ambulatory Visit: Payer: No Typology Code available for payment source | Admitting: Neurology

## 2017-02-20 ENCOUNTER — Encounter: Payer: Self-pay | Admitting: Neurology

## 2017-02-20 ENCOUNTER — Ambulatory Visit (INDEPENDENT_AMBULATORY_CARE_PROVIDER_SITE_OTHER): Payer: Medicare Other | Admitting: Neurology

## 2017-02-20 DIAGNOSIS — H53451 Other localized visual field defect, right eye: Secondary | ICD-10-CM | POA: Insufficient documentation

## 2017-02-20 DIAGNOSIS — R260 Ataxic gait: Secondary | ICD-10-CM | POA: Diagnosis not present

## 2017-02-20 DIAGNOSIS — R55 Syncope and collapse: Secondary | ICD-10-CM | POA: Diagnosis not present

## 2017-02-20 NOTE — Progress Notes (Signed)
[facility]  PATIENT: Christy Short DOB: 1945-12-24  REFERRING DOCTOR OR PCP:  Christy Short SOURCE: Patient, notes from Christy Short, Christy Short, Christy Short and emergency room, imaging and lab reports, MRI/MRA and CT images.  _________________________________   HISTORICAL  CHIEF COMPLAINT:  Chief Complaint  Patient presents with  . Loss of Consciousness    She is here with her sister, Christy Short.  Reports two falls - one resulted in a fractured finger and one resulted in fractured ribs.  She felt hot and sweaty prior to falling.  She remembers falling but lost some time around the second incident.  . Orthostatic Vitals    Lying: 131/65, 87, Sitting: 127/69, 87, Standing: 128/69, 91, Standing x 3 minutes: 125/92,     HISTORY OF PRESENT ILLNESS:  I had the pleasure seeing you patient, Christy Short, at [facility] for neurologic consultation regarding her syncope.  On 10/22/2016, she had had diarrhea but went to a church potluck earlier that day and then returned home.. When she went up to use the bathroom, she felt hot and then cold and clammy.   She was also sweaty. She had a sensation of lightheadedness and then lost consciousness.  There was no loss of bowel or bladder. She felt out of it for only 30 or 60 seconds and then was back to her baseline. Went to the ED and was found to have broken ribs (left second and right first. Christy Short check some blood work and EKG. The EKG showed borderline sinus tachycardia with left axis deviation inferior Q waves with nonspecific ST-T wave changes. She was referred to Christy Short of cardiology who felt that the episode of syncope was most likely vasovagal. For risk stratification she did have an echocardiogram and a 48 hour Holter monitor.   The Holter monitor did show very rare PVCs and PACs.   I do not have her recent echocardiogram but one from 10/22/2015 was essentially normal. She just had trace regurgitation and trace aortic  insufficiency and trace tricuspid regurgitation.   EF was normal.   She denies other episodes of LOC.      She denies history of seizure or stroke.      She has not had other episodes of syncope.   She had lightheadedness and confusion that came on suddenly  10/21/2015.  q images were personally reviewed her Head CT was normal for age.  MRI of the brain shows age-appropriate mild chronic medical vascular ischemic changes but no acute findings.Marland Kitchen  MRA showed no distal right PCA flow.     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 She had not noted the visual field problems until it was brought to her attention.  She also sees hematology for MGUS (Christy Short).   Her routine blood work has been good with no monoclonal protein 03/2016.   She has had some numbness in her right foot since an ankle fracture on the right.   Left foot is fine.    However, she had pulmonary embolism in the past.    She was anticoagulated for a while.  No HTN.  Is borderline diabetic  REVIEW OF SYSTEMS: Constitutional: No fevers, chills, sweats, or change in appetite.   Notes fatigue Eyes: No visual changes, double vision, eye pain Ear, nose and throat: No hearing loss, ear pain, nasal congestion, sore throat Cardiovascular: No chest pain, palpitations Respiratory: No shortness  of breath at rest or with exertion.   No wheezes GastrointestinaI: No nausea, vomiting, diarrhea, abdominal pain, fecal incontinence Genitourinary: No dysuria, urinary retention or frequency.  No nocturia. Musculoskeletal: No neck pain, back pain.   Notes some joint pain and swelling. Integumentary: No rash, pruritus, skin lesions Neurological: as above Psychiatric: No depression at this time.  No anxiety Endocrine: No palpitations, diaphoresis, change in appetite, change in weigh or increased thirst Hematologic/Lymphatic: Has history of pulmonary embolus. Has  history of MGUS.Marland Kitchen. Allergic/Immunologic: No itchy/runny eyes, nasal congestion, recent allergic reactions, rashes  ALLERGIES: No Known Allergies  HOME MEDICATIONS:  Current Outpatient Medications:  .  alendronate (FOSAMAX) 70 MG tablet, Take 70 mg by mouth every Thursday. Take with a full glass of water on an empty stomach., Disp: , Rfl:  .  atorvastatin (LIPITOR) 40 MG tablet, Take 20 mg by mouth daily at 6 PM., Disp: , Rfl:  .  Multiple Vitamin (MULTIVITAMIN) tablet, Take 1 tablet by mouth daily., Disp: , Rfl:  .  omeprazole (PRILOSEC) 40 MG capsule, Take 40 mg by mouth., Disp: , Rfl:  .  polyethylene glycol (MIRALAX / GLYCOLAX) packet, Take 17 g by mouth daily. (Patient taking differently: Take 17 g by mouth at bedtime as needed. ), Disp: 14 each, Rfl: 0 .  QUEtiapine (SEROQUEL) 25 MG tablet, Take 25 mg by mouth at bedtime., Disp: , Rfl:  .  venlafaxine XR (EFFEXOR-XR) 150 MG 24 hr capsule, Take  1 a day, Disp: , Rfl:   PAST MEDICAL HISTORY: Past Medical History:  Diagnosis Date  . Depression   . HTN (hypertension)   . Hyperlipidemia   . Hyperparathyroidism (HCC)   . Pulmonary embolism (HCC) 10/2013  . Syncope     PAST SURGICAL HISTORY: Past Surgical History:  Procedure Laterality Date  . blood clot     legs, lung  . CESAREAN SECTION    . ORIF ANKLE FRACTURE    . PARATHYROIDECTOMY     x2  . TONSILLECTOMY      FAMILY HISTORY: Family History  Problem Relation Age of Onset  . Stroke Mother   . Hypertension Mother   . Kidney cancer Father     SOCIAL HISTORY:  Social History   Socioeconomic History  . Marital status: Married    Spouse name: Not on file  . Number of children: 1  . Years of education: 10th  . Highest education level: Not on file  Social Needs  . Financial resource strain: Not on file  . Food insecurity - worry: Not on file  . Food insecurity - inability: Not on file  . Transportation needs - medical: Not on file  . Transportation needs -  non-medical: Not on file  Occupational History  . Occupation: Retired  Tobacco Use  . Smoking status: Never Smoker  . Smokeless tobacco: Never Used  Substance and Sexual Activity  . Alcohol use: No  . Drug use: No  . Sexual activity: Not on file  Other Topics Concern  . Not on file  Social History Narrative   Lives at home alone.   Right-handed.   1-2 cups caffeine per day.     PHYSICAL EXAM  Vitals:   02/20/17 1349  BP: 131/65  Pulse: 87  Weight: 168 lb 8 oz (76.4 kg)  Height: 5\' 4"  (1.626 m)   Orthostatics:   Lying: 131/65, 87, Sitting: 127/69, 87, Standing: 128/69, 91, Standing x 3 minutes: 125/92  Body mass index is 28.92 kg/m.  General: The patient is well-developed and well-nourished and in no acute distress  Eyes:  Funduscopic exam shows normal optic discs and retinal vessels.  Neck: The neck is supple, no carotid bruits are noted.  The neck is nontender.  Cardiovascular: The heart has a regular rate and rhythm with a normal S1 and S2. There were no murmurs, gallops or rubs. Lungs are clear to auscultation.  Skin: Extremities are without significant edema.  Musculoskeletal:  Back is nontender  Neurologic Exam  Mental status: The patient is alert and oriented x 3 at the time of the examination. The patient has apparent normal recent and remote memory, with an apparently normal attention span and concentration ability.   Speech is normal.  Cranial nerves: Extraocular movements are full. Pupils are equal, round, and reactive to light and accomodation.  She has a right inferior nasal quadrantanopsia. Visual fields appeared to be normal out of the left eye..  Facial symmetry is present. There is good facial sensation to soft touch bilaterally.Facial strength is normal.  Trapezius and sternocleidomastoid strength is normal. No dysarthria is noted.  The tongue is midline, and the patient has symmetric elevation of the soft palate. No obvious hearing deficits are  noted.  Motor:  Muscle bulk is normal.   Tone is normal. Strength is  5 / 5 in all 4 extremities.   Sensory: Sensory testing is intact to pinprick, soft touch and vibration sensation in the arms but reduced vibration at the toes.  Coordination: Cerebellar testing reveals good finger-nose-finger and heel-to-shin bilaterally.  Gait and station: Station is normal.   Gait is normal. Tandem gait is wide. Romberg is negative.   Reflexes: Deep tendon reflexes are symmetric and normal bilaterally.   Plantar responses are flexor.    DIAGNOSTIC DATA (LABS, IMAGING, TESTING) - I reviewed patient records, labs, notes, testing and imaging myself where available.  Lab Results  Component Value Date   WBC 19.2 (H) 11/02/2013   HGB 10.2 (L) 11/02/2013   HCT 31.7 (L) 11/02/2013   MCV 87.6 11/02/2013   PLT 668 (H) 11/02/2013      Component Value Date/Time   NA 137 11/02/2013 0550   K 3.6 (L) 11/02/2013 0550   CL 95 (L) 11/02/2013 0550   CO2 31 11/02/2013 0550   GLUCOSE 73 11/02/2013 0550   BUN 18 11/02/2013 0550   CREATININE 0.91 11/02/2013 0550   CALCIUM 9.1 11/02/2013 0550   PROT 6.2 10/27/2013 1510   ALBUMIN 2.5 (L) 10/23/2013 2011   AST 37 10/23/2013 2011   ALT 41 (H) 10/23/2013 2011   ALKPHOS 214 (H) 10/23/2013 2011   BILITOT 0.7 10/23/2013 2011   GFRNONAA 64 (L) 11/02/2013 0550   GFRAA 74 (L) 11/02/2013 0550      ASSESSMENT AND PLAN  Syncope, unspecified syncope type - Plan: Sedimentation rate, Short w/Reflex, Pan-ANCA, MR BRAIN WO CONTRAST  Peripheral visual field defect, right - Plan: Sedimentation rate, Short w/Reflex, Pan-ANCA, MR BRAIN WO CONTRAST  Ataxic gait - Plan: Sedimentation rate, Short w/Reflex, Pan-ANCA, MR BRAIN WO CONTRAST   In summary, Mrs. Suzie Portelaayne is a 71 year old woman who had an episode of syncope August 2018 and also had an episode of presyncope August 2017. On examination today, she did not have any evidence of orthostatic hypotension. She did have slight  numbness in the toes and a wide tandem gait. Additionally, she has a monocular right inferior nasal quadrantanopsia. The MRI of the brain in 2017 was normal but the MR angiogram showed  no flow distally in the right posterior cerebral artery.       Most likely, the syncope was due to orthostasis from dehydration or a vasovagal event.    However, as she also has the visual field defect and mild abnormal gait we need to check an MRI of the brain to determine if she has had a stroke or other process. The mild gait issues and mild numbness in the feet could also be due to a mild polyneuropathy and she is prediabetic and has MGUS, two possible explanations.  We will call her with the results and follow up as needed based on the findings of the studies and she is advised to call us if she has any new or worsening neurologic symptoms.   Preciliano Castell A. Epimenio Foot, MD, [facility] 02/20/2017, 4:40 PM Certified in Neurology, Clinical Neurophysiology, Sleep Medicine, Pain Medicine and Neuroimaging  [facility] [address] [place], [place] [address] [phone]

## 2017-02-22 LAB — PAN-ANCA
ANCA Proteinase 3: 4.9 U/mL — ABNORMAL HIGH (ref 0.0–3.5)
C-ANCA: <1:20 {titer}
Myeloperoxidase Ab: <9 U/mL (ref 0.0–9.0)

## 2017-02-22 LAB — SEDIMENTATION RATE: Sed Rate: 6 mm/hr (ref 0–40)

## 2017-02-22 LAB — ANA W/REFLEX: ANA: NEGATIVE

## 2017-02-27 ENCOUNTER — Telehealth: Payer: Self-pay | Admitting: Neurology

## 2017-02-27 NOTE — Telephone Encounter (Signed)
Pt calling re: her MRI she was told to call back to our office because she is need of an open MRI, not the tube.  Please call

## 2017-03-03 ENCOUNTER — Other Ambulatory Visit: Payer: Medicare Other

## 2017-03-11 NOTE — Telephone Encounter (Signed)
Noted, I faxed the order to [facility]. They will contact the patient to schedule.

## 2017-03-13 NOTE — Telephone Encounter (Signed)
Patient is scheduled at [facility] for 03/15/17.

## 2017-03-15 ENCOUNTER — Encounter: Payer: Self-pay | Admitting: Orthopedic Surgery

## 2017-03-28 ENCOUNTER — Telehealth: Payer: Self-pay | Admitting: Neurology

## 2017-03-28 NOTE — Telephone Encounter (Signed)
Pt called back, msg relayed, she understood. She did question if it normally takes this long to get results from [facility].

## 2017-03-28 NOTE — Telephone Encounter (Signed)
VM not set up, so not able to leave a message.  It looks like she had her MRI done at [facility], and I don't believe [facility] has received the cd of the MRI yet./fim

## 2017-03-28 NOTE — Telephone Encounter (Signed)
error 

## 2017-03-28 NOTE — Telephone Encounter (Signed)
noted/fim 

## 2017-03-28 NOTE — Telephone Encounter (Signed)
Pt request MRI results.  °

## 2017-04-05 NOTE — Telephone Encounter (Signed)
Pt called back to find out if MRI results are back. She is aware the clinic closes at noon today.

## 2017-04-08 NOTE — Telephone Encounter (Signed)
Spoke to patient - she is aware that we are waiting on the disc and will call her with the results.

## 2017-04-19 NOTE — Telephone Encounter (Signed)
Pt called she does not understand why it is taking so long. Pt is frustrated. Please call

## 2017-04-19 NOTE — Telephone Encounter (Signed)
LMOM for [facility] to mail CD of images of MRI brain done in Dec.-Jan. Pt. is aware/fim

## 2017-04-22 ENCOUNTER — Telehealth: Payer: Self-pay | Admitting: *Deleted

## 2017-04-22 NOTE — Telephone Encounter (Signed)
CD of MRI given to [facility] this afternoon/fim

## 2017-04-22 NOTE — Telephone Encounter (Signed)
-----   Message from Asa Lenteichard A Sater, MD sent at 04/22/2017  3:44 PM EST ----- Regarding: MRI result Please let her know that we finally got the outside MRI and I was able to compare it with [facility].  There are no changes on the new MRI compared to the old MRI. Specifically there are no strokes.

## 2017-04-22 NOTE — Telephone Encounter (Signed)
Spoke with Bonita QuinLinda and reviewed below MRI results.  She verbalized understanding of same/fim
